# Patient Record
Sex: Male | Born: 1950 | Race: White | Hispanic: No | Marital: Married | Smoking: Never smoker
Health system: Southern US, Community
[De-identification: ages and names within clinical notes are randomized; demographics above are authoritative.]

## PROBLEM LIST (undated history)

## (undated) DIAGNOSIS — C801 Malignant (primary) neoplasm, unspecified: Secondary | ICD-10-CM

## (undated) DIAGNOSIS — M1990 Unspecified osteoarthritis, unspecified site: Secondary | ICD-10-CM

## (undated) DIAGNOSIS — C61 Malignant neoplasm of prostate: Secondary | ICD-10-CM

## (undated) DIAGNOSIS — J189 Pneumonia, unspecified organism: Secondary | ICD-10-CM

## (undated) DIAGNOSIS — I10 Essential (primary) hypertension: Secondary | ICD-10-CM

## (undated) DIAGNOSIS — E785 Hyperlipidemia, unspecified: Secondary | ICD-10-CM

## (undated) DIAGNOSIS — Z22322 Carrier or suspected carrier of Methicillin resistant Staphylococcus aureus: Secondary | ICD-10-CM

## (undated) DIAGNOSIS — Z87442 Personal history of urinary calculi: Secondary | ICD-10-CM

## (undated) HISTORY — DX: Carrier or suspected carrier of methicillin resistant Staphylococcus aureus: Z22.322

## (undated) HISTORY — PX: KNEE ARTHROSCOPY: SHX127

## (undated) HISTORY — DX: Malignant neoplasm of prostate: C61

## (undated) HISTORY — DX: Malignant (primary) neoplasm, unspecified: C80.1

## (undated) HISTORY — DX: Hyperlipidemia, unspecified: E78.5

## (undated) HISTORY — PX: TONSILLECTOMY: SUR1361

## (undated) HISTORY — PX: PROSTATE SURGERY: SHX751

---

## 1988-11-16 HISTORY — PX: SHOULDER ARTHROSCOPY: SHX128

## 2001-11-16 DIAGNOSIS — Z22322 Carrier or suspected carrier of Methicillin resistant Staphylococcus aureus: Secondary | ICD-10-CM

## 2001-11-16 HISTORY — DX: Carrier or suspected carrier of methicillin resistant Staphylococcus aureus: Z22.322

## 2003-11-17 HISTORY — PX: COLONOSCOPY: SHX174

## 2008-06-20 ENCOUNTER — Ambulatory Visit: Payer: Self-pay | Admitting: Internal Medicine

## 2009-09-26 ENCOUNTER — Ambulatory Visit: Payer: Self-pay | Admitting: Internal Medicine

## 2010-02-21 ENCOUNTER — Ambulatory Visit: Payer: Self-pay | Admitting: Urology

## 2010-04-17 ENCOUNTER — Inpatient Hospital Stay (HOSPITAL_COMMUNITY): Admission: RE | Admit: 2010-04-17 | Discharge: 2010-04-18 | Payer: Self-pay | Admitting: Urology

## 2010-04-17 ENCOUNTER — Encounter (INDEPENDENT_AMBULATORY_CARE_PROVIDER_SITE_OTHER): Payer: Self-pay | Admitting: Urology

## 2011-02-02 LAB — HEMOGLOBIN AND HEMATOCRIT, BLOOD
Hemoglobin: 13.7 g/dL (ref 13.0–17.0)
Hemoglobin: 14.7 g/dL (ref 13.0–17.0)

## 2011-02-02 LAB — CBC
HCT: 47.3 % (ref 39.0–52.0)
Hemoglobin: 15.7 g/dL (ref 13.0–17.0)
MCV: 98 fL (ref 78.0–100.0)
Platelets: 187 10*3/uL (ref 150–400)
RBC: 4.82 MIL/uL (ref 4.22–5.81)
WBC: 8 10*3/uL (ref 4.0–10.5)

## 2011-02-02 LAB — ABO/RH: ABO/RH(D): A POS

## 2011-02-02 LAB — BASIC METABOLIC PANEL
Calcium: 9.4 mg/dL (ref 8.4–10.5)
Creatinine, Ser: 1.12 mg/dL (ref 0.4–1.5)
GFR calc non Af Amer: >60 mL/min (ref >60)
Glucose, Bld: 122 mg/dL — ABNORMAL HIGH (ref 70–99)
Potassium: 4.2 mEq/L (ref 3.5–5.1)

## 2011-11-17 DIAGNOSIS — J189 Pneumonia, unspecified organism: Secondary | ICD-10-CM

## 2011-11-17 HISTORY — DX: Pneumonia, unspecified organism: J18.9

## 2012-05-16 LAB — BASIC METABOLIC PANEL
Anion Gap: 5 — ABNORMAL LOW (ref 7–16)
BUN: 18 mg/dL (ref 7–18)
Creatinine: 1.08 mg/dL (ref 0.60–1.30)
EGFR (Non-African Amer.): >60
Osmolality: 284 (ref 275–301)
Sodium: 141 mmol/L (ref 136–145)

## 2012-05-16 LAB — CBC
HGB: 15.1 g/dL (ref 13.0–18.0)
Platelet: 189 10*3/uL (ref 150–440)
RBC: 4.72 10*6/uL (ref 4.40–5.90)
WBC: 8.1 10*3/uL (ref 3.8–10.6)

## 2012-05-16 LAB — TROPONIN I: Troponin-I: 0.03 ng/mL

## 2012-05-16 LAB — CK TOTAL AND CKMB (NOT AT ARMC): CK, Total: 137 U/L (ref 35–232)

## 2012-05-17 ENCOUNTER — Observation Stay: Payer: Self-pay | Admitting: Internal Medicine

## 2012-05-17 LAB — LIPID PANEL
Cholesterol: 119 mg/dL (ref 0–200)
HDL Cholesterol: 31 mg/dL — ABNORMAL LOW (ref 40–60)
Triglycerides: 119 mg/dL (ref 0–200)

## 2012-05-17 LAB — TROPONIN I: Troponin-I: 0.03 ng/mL

## 2014-02-01 ENCOUNTER — Ambulatory Visit: Payer: Self-pay | Admitting: Internal Medicine

## 2014-02-12 ENCOUNTER — Ambulatory Visit: Payer: Self-pay | Admitting: Internal Medicine

## 2014-09-19 ENCOUNTER — Encounter: Payer: Self-pay | Admitting: *Deleted

## 2014-10-15 ENCOUNTER — Other Ambulatory Visit: Payer: Self-pay | Admitting: *Deleted

## 2014-10-15 ENCOUNTER — Encounter: Payer: Self-pay | Admitting: *Deleted

## 2014-10-15 ENCOUNTER — Encounter: Payer: Self-pay | Admitting: General Surgery

## 2014-10-15 ENCOUNTER — Telehealth: Payer: Self-pay | Admitting: *Deleted

## 2014-10-15 ENCOUNTER — Ambulatory Visit (INDEPENDENT_AMBULATORY_CARE_PROVIDER_SITE_OTHER): Payer: BC Managed Care – PPO | Admitting: General Surgery

## 2014-10-15 VITALS — BP 140/76 | HR 76 | Resp 12 | Ht 66.0 in | Wt 199.0 lb

## 2014-10-15 DIAGNOSIS — Z1211 Encounter for screening for malignant neoplasm of colon: Secondary | ICD-10-CM

## 2014-10-15 MED ORDER — POLYETHYLENE GLYCOL 3350 17 GM/SCOOP PO POWD
ORAL | Status: DC
Start: 2014-10-15 — End: 2018-04-22

## 2014-10-15 NOTE — Telephone Encounter (Signed)
Patient contacted and informed that Dr. Jamal Collin will not be in Endoscopy on 11-21-2014 due to a schedule change. We have rescheduled colonoscopy to 12-05-2014 at [facility].   Trish in Endoscopy has been notified of date change.

## 2014-10-15 NOTE — Patient Instructions (Signed)
Colonoscopy  A colonoscopy is an exam to look at the entire large intestine (colon). This exam can help find problems such as tumors, polyps, inflammation, and areas of bleeding. The exam takes about 1 hour.   LET YOUR HEALTH CARE PROVIDER KNOW ABOUT:   · Any allergies you have.  · All medicines you are taking, including vitamins, herbs, eye drops, creams, and over-the-counter medicines.  · Previous problems you or members of your family have had with the use of anesthetics.  · Any blood disorders you have.  · Previous surgeries you have had.  · Medical conditions you have.  RISKS AND COMPLICATIONS   Generally, this is a safe procedure. However, as with any procedure, complications can occur. Possible complications include:  · Bleeding.  · Tearing or rupture of the colon wall.  · Reaction to medicines given during the exam.  · Infection (rare).  BEFORE THE PROCEDURE   · Ask your health care provider about changing or stopping your regular medicines.  · You may be prescribed an oral bowel prep. This involves drinking a large amount of medicated liquid, starting the day before your procedure. The liquid will cause you to have multiple loose stools until your stool is almost clear or light green. This cleans out your colon in preparation for the procedure.  · Do not eat or drink anything else once you have started the bowel prep, unless your health care provider tells you it is safe to do so.  · Arrange for someone to drive you home after the procedure.  PROCEDURE   · You will be given medicine to help you relax (sedative).  · You will lie on your side with your knees bent.  · A long, flexible tube with a light and camera on the end (colonoscope) will be inserted through the rectum and into the colon. The camera sends video back to a computer screen as it moves through the colon. The colonoscope also releases carbon dioxide gas to inflate the colon. This helps your health care provider see the area better.  · During  the exam, your health care provider may take a small tissue sample (biopsy) to be examined under a microscope if any abnormalities are found.  · The exam is finished when the entire colon has been viewed.  AFTER THE PROCEDURE   · Do not drive for 24 hours after the exam.  · You may have a small amount of blood in your stool.  · You may pass moderate amounts of gas and have mild abdominal cramping or bloating. This is caused by the gas used to inflate your colon during the exam.  · Ask when your test results will be ready and how you will get your results. Make sure you get your test results.  Document Released: 10/30/2000 Document Revised: 08/23/2013 Document Reviewed: 07/10/2013  Exit[employer]® Patient Information ©2015 [employer]. This information is not intended to replace advice given to you by your health care provider. Make sure you discuss any questions you have with your health care provider.

## 2014-10-15 NOTE — Progress Notes (Signed)
Patient ID: Dalton Dunn, male   DOB: Jun 15, 1951, 63 y.o.   MRN: [id number]  Chief Complaint  Patient presents with  . Follow-up    colonoscopy    HPI Dalton Dunn is a 63 y.o. male here today for a evaluation of a screening colonoscopy. Patient states he has no GI problems at this time. Patient last colonoscopy was done in 2005  HPI  Past Medical History  Diagnosis Date  . Hyperlipidemia   . MRSA (methicillin resistant staph aureus) culture positive 2003  . Cancer [phone]    skin   . Prostate cancer     Past Surgical History  Procedure Laterality Date  . Prostate surgery    . Tonsillectomy    . Colonoscopy  2005    History reviewed. No pertinent family history.  Social History History  Substance Use Topics  . Smoking status: Never Smoker   . Smokeless tobacco: Never Used  . Alcohol Use: 0.0 oz/week    0 Not specified per week    Allergies  Allergen Reactions  . Penicillin V Potassium Nausea And Vomiting    Current Outpatient Prescriptions  Medication Sig Dispense Refill  . aspirin EC 81 MG tablet Take 81 mg by mouth once.     Marland Kitchen atorvastatin (LIPITOR) 10 MG tablet Take 10 mg by mouth daily at 6 PM.     . betamethasone valerate (VALISONE) 0.1 % cream Apply 1 application topically.     Marland Kitchen lisinopril (PRINIVIL,ZESTRIL) 20 MG tablet Take 20 mg by mouth daily.     . sildenafil (REVATIO) 20 MG tablet Take 20 mg by mouth 3 (three) times daily. Take 3 to 5 tablets po daily as needed     No current facility-administered medications for this visit.    Review of Systems Review of Systems  Respiratory: Negative.   Gastrointestinal: Negative.     Blood pressure 140/76, pulse 76, resp. rate 12, height 5\' 6"  (1.676 m), weight 199 lb (90.266 kg).  Physical Exam Physical Exam  Constitutional: He is oriented to person, place, and time. He appears well-developed and well-nourished.  Eyes: Conjunctivae are normal. No scleral icterus.  Neck: Neck supple. No  thyromegaly present.  Cardiovascular: Normal rate and regular rhythm.   Murmur heard.  Systolic murmur is present with a grade of 1/6  Patient has had echo for murmur  Pulmonary/Chest: Effort normal and breath sounds normal.  Abdominal: Soft. Normal appearance and bowel sounds are normal. There is no hepatosplenomegaly. There is no tenderness. No hernia.  Lymphadenopathy:    He has no cervical adenopathy.  Neurological: He is alert and oriented to person, place, and time.  Skin: Skin is warm and dry.    Data Reviewed None  Assessment    Stable exam. Patient due for screening colonoscopy     Plan    Discussed colonoscopy, schedule for next month per pt request        SANKAR,SEEPLAPUTHUR G 10/15/2014, 11:51 AM

## 2014-10-15 NOTE — Progress Notes (Signed)
Patient ID: Dalton Dunn, male   DOB: 08/30/1951, 63 y.o.   MRN: [id number]  Patient has been scheduled for a colonoscopy on 11-21-2014 at [facility]. It is okay for patient to continue 81 mg aspirin.   Miralax prescription has been sent to patient's pharmacy.

## 2014-11-29 ENCOUNTER — Other Ambulatory Visit: Payer: Self-pay | Admitting: General Surgery

## 2014-11-29 DIAGNOSIS — Z1211 Encounter for screening for malignant neoplasm of colon: Secondary | ICD-10-CM

## 2014-12-05 ENCOUNTER — Ambulatory Visit: Payer: Self-pay | Admitting: General Surgery

## 2014-12-05 DIAGNOSIS — Z1211 Encounter for screening for malignant neoplasm of colon: Secondary | ICD-10-CM

## 2014-12-11 ENCOUNTER — Encounter: Payer: Self-pay | Admitting: General Surgery

## 2015-03-10 NOTE — Discharge Summary (Signed)
PATIENT NAME:  Dalton Dunn, Dalton Dunn MR#:  [id number] DATE OF BIRTH:  05-06-1951  DATE OF ADMISSION:  05/17/2012 DATE OF DISCHARGE:  05/17/2012  PRIMARY CARE PHYSICIAN: Benita Stabile, MD   CARDIOLOGIST: Isaias Cowman, MD   FINAL DIAGNOSES:  1. Chest pain, possibly GI related, cannot rule out cardiac.  2. Hypertension.  3. Hyperlipidemia.  4. History of prostate cancer.   MEDICATIONS ON DISCHARGE:  1. Lisinopril 20 mg daily.  2. Lipitor 10 mg at bedtime.  3. Cialis 5 mg daily. 4. Centrum 1 tablet daily.   ADDITIONAL MEDICATIONS: Aspirin 81 mg daily.   DIET: Low sodium diet.   ACTIVITY: Activity as tolerated.   FOLLOW-UP:  1. Follow-up with Dr. Saralyn Pilar for stress test on Monday in his office. 2. Follow-up in 2 to 4 weeks with Dr. Benita Stabile.   DO NOT TAKE: Advil.   REASON FOR ADMISSION: The patient came in and was discharged on 05/17/2012. The patient came in with chest pain.   HISTORY OF PRESENT ILLNESS: The patient is a 64 year old man with history of hypertension and hyperlipidemia who presented with chest pain, mild shortness of breath, and he was admitted with chest pain. Cardiology consultation was obtained. Cardiac enzymes were ordered. He was given a dose of Lovenox and aspirin.   LABORATORY AND RADIOLOGICAL DATA DURING THE HOSPITAL COURSE: EKG normal sinus rhythm, left atrial enlargement.   Cardiac enzymes x3 were negative. Glucose 111, BUN 18, creatinine 1.08, sodium 141, potassium 3.5, chloride 109, CO2 27, calcium 8.7, white blood cell count 8.1, hemoglobin and hematocrit 15.1 and 46.3, platelet count 189.   Chest x-ray no acute cardiopulmonary abnormality.   LDL 64, HDL 31, triglycerides 119.   HOSPITAL COURSE PER PROBLEM LIST:  1. For the patient's chest pain, this had resolved. The patient had three sets of cardiac enzymes that were negative. This is not a myocardial infarction. Still need to rule out cardiac cause with stress test. This will be set up  through Dr. Saralyn Pilar' office on Monday. Dr. Saralyn Pilar did see as a consultation. Could also be GI related. The patient will be kept on an aspirin for cardioprotection in the meantime.  2. For his hypertension, the patient will be kept on his lisinopril. Blood pressure variable during the hospital stay, as high as 159/84 and as low as 126/70. 3. For his hyperlipidemia, cholesterol profile was excellent. LDL already under 70. HDL is also low. Would recommend exercise after stress test.   4. For his history of prostate cancer, he is on Cialis.  TIME SPENT ON DISCHARGE: 35 minutes.   CONDITION ON DISCHARGE: The patient was discharged home in stable condition.   ____________________________ Tana Conch. Leslye Peer, MD rjw:drc D: 05/17/2012 15:10:56 ET T: 05/18/2012 10:18:03 ET JOB#: [id number]  cc: Tana Conch. Leslye Peer, MD, <Dictator>, Leona Carry. Hall Busing, MD, Isaias Cowman, MD Marisue Brooklyn MD ELECTRONICALLY SIGNED 05/19/2012 12:21

## 2015-03-10 NOTE — Consult Note (Signed)
PATIENT NAME:  Dalton Dunn, Dalton Dunn MR#:  [id number] DATE OF BIRTH:  11-15-1951  DATE OF CONSULTATION:  05/17/2012  REFERRING PHYSICIAN:   CONSULTING PHYSICIAN:  Isaias Cowman, MD  PRIMARY CARE PHYSICIAN: Dr. Hall Busing   CHIEF COMPLAINT: Chest discomfort.   HISTORY OF PRESENT ILLNESS: Patient is a 64 year old gentleman referred for evaluation of chest pain. Patient reports a one-week history of intermittent mild chest discomfort. Patient has been active, exercising regularly without difficulty. On 05/16/2012 the patient was taking out the trash and felt chest discomfort. His wife took him to the urgent care. His blood pressure was elevated and the patient was sent to the [facility] Emergency Room. EKG was nondiagnostic. The patient was admitted to telemetry where he has ruled out for myocardial infarction by CPK isoenzymes and troponin. Patient has had no recurrent chest pain.   PAST MEDICAL HISTORY:  1. Hypertension.  2. Hyperlipidemia.   MEDICATIONS:  1. Lipitor 10 mg at bedtime.  2. Lisinopril 20 mg daily.  3. Cialis 5 mg q.p.m.   SOCIAL HISTORY: Patient is a retired Biomedical scientist. He denies tobacco or EtOH abuse.   FAMILY HISTORY: Brother status post myocardial infarction at age 50.   REVIEW OF SYSTEMS: CONSTITUTIONAL: No fever or chills. EYES: No blurry vision. EARS: No hearing loss. RESPIRATORY: No shortness of breath. CARDIOVASCULAR: Chest discomfort as described above. GASTROINTESTINAL: No nausea, vomiting, diarrhea or constipation. ENDOCRINE: No polyuria or polydipsia. MUSCULOSKELETAL: No arthralgias, myalgias. NEUROLOGICAL: No focal muscle weakness or numbness. PSYCHOLOGICAL: No depression or anxiety.   PHYSICAL EXAMINATION:  VITAL SIGNS: Blood pressure 135/76, pulse 59, respirations 18, temperature 98.5, pulse oximetry 98%.   HEENT: Pupils equal, reactive to light and accommodation.   NECK: Supple without thyromegaly.   LUNGS: Clear.    CARDIOVASCULAR: Normal jugular venous pressure. Normal point of maximal impulse. Regular rate and rhythm. Normal S1, S2. No appreciable gallop, murmur, rub.   ABDOMEN: Soft and nontender. Pulses were intact bilaterally.   MUSCULOSKELETAL: Normal muscle tone.   NEUROLOGIC: Patient was alert and oriented x3. Motor and sensory both grossly intact.   IMPRESSION: 64 year old gentleman with history of hypertension who presents with chest pain in the setting of elevated blood pressure. EKG was nondiagnostic without any acute ischemic ST-T wave changes. Patient did have borderline left atrial enlargement. Patient did have elevated blood pressure likely due to dietary noncompliance. Patient apparently adds salt to his meals.    RECOMMENDATIONS:  1. Continue lisinopril.  2. Would defer beta blocker since patient has bradycardia baseline.  3. Will proceed with ETT sestamibi study which may be performed as an outpatient. Patient has ruled out for myocardial infarction by cardiac isoenzymes and troponin with good prognosis. 4. Follow up after ETT sestamibi study.  5. Had lengthy discussion about the importance of low sodium diet.  ____________________________ Isaias Cowman, MD ap:cms D: 05/17/2012 12:55:59 ET T: 05/17/2012 13:31:20 ET  JOB#: [id number] cc: Isaias Cowman, MD, <Dictator>  Isaias Cowman MD ELECTRONICALLY SIGNED 05/26/2012 8:36

## 2015-03-10 NOTE — H&P (Signed)
PATIENT NAME:  Dalton Dunn, Dalton Dunn MR#:  [id number] DATE OF BIRTH:  January 13, 1951  DATE OF ADMISSION:  05/17/2012   PRIMARY CARE PHYSICIAN: Benita Stabile, MD   REFERRING PHYSICIAN: Dr. Renard Hamper    CHIEF COMPLAINT: Chest pain.   HISTORY OF PRESENT ILLNESS: This is a 64 year old male with significant past medical history of hypertension and hyperlipidemia who presents with chest pain. The patient reports chest pain started at around 4 p.m. when he was throwing some trash. The patient reported he felt midchest tightness radiating to the neck. It was accompanied by some mild shortness of breath. The patient denies any palpitation, diaphoresis, lightheadedness, nausea, or altered mental status. by reviewing the patient's records, he had a stress test done in 2002 by [facility] which revealed some mild ischemia in the anterior area. The patient reports he is not on aspirin. He denies any previous episodes of chest pain. Upon presentation to the ED, the patient's first set of cardiac enzymes was negative. The patient did not have any significant ST wave changes on his EKG. No old EKG to compare. The patient currently reports his chest pain resolved. The patient was given one 62 of aspirin and was given one dose of sub-Q Lovenox for treatment for possible unstable angina. Hospitalist service was requested to admit the patient for further evaluation of his chest pain. The patient reports he usually walks 3 miles every day at a moderate pace which he did today without any complaints of chest pain or shortness of breath.   PAST MEDICAL HISTORY:  1. History of prostate cancer.  2. Hyperlipidemia.  3. Hypertension.   PAST SURGICAL HISTORY:  1. Tonsillectomy.  2. Robotic prostate surgery.  3. Right knee arthroscopy.   ALLERGIES: Penicillin.   HOME MEDICATIONS:  1. Centrum oral 1 tablet oral daily.  2. Cialis 5 mg oral every night, last time taking last night.  3. Lipitor 10 mg oral at bedtime.   4. Lisinopril 20 mg oral daily.   SOCIAL HISTORY: The patient retired recently. Used to work as a Biomedical scientist. No history of smoking. Drinks socially. No drug abuse.   FAMILY HISTORY: Significant for diabetes mellitus and MI at age 42 in his brother. His mother had MI at age 28. His grandmother is alive at the age of 6.   REVIEW OF SYSTEMS: Denies any fever, fatigue, weakness. EYES: Denies blurry vision, double vision, eye pain. ENT: Denies tinnitus, ear pain, hearing loss. RESPIRATORY: Denies any cough, wheezing, hemoptysis, dyspnea, asthma. CARDIOVASCULAR: Has complaints of chest pain. Describes it as tightness, midsternal, radiating to the neck. Denies any edema, arrhythmia, palpitations, syncope. GI: Denies any nausea, vomiting, diarrhea, abdominal pain, hematemesis. GU: Denies any dysuria, hematuria, renal colic, or frequency. GU: Denies any sores. Has history of prostate cancer. ENDOCRINE: Denies any polyuria, polydipsia, heat or cold intolerance, or any thyroid problems. HEMATOLOGY: Denies any anemia, easy bruising, bleeding diathesis. INTEGUMENTARY: Denies any acne, rash, or itching. MUSCULOSKELETAL: Has lower back pain recently due to muscle spasm. He has been taking NSAIDs for that. NEUROLOGIC: Denies any numbness, weakness, dysarthria, epilepsy, tremors, or vertigo. PSYCH: Denies any anxiety, insomnia, alcohol or substance abuse.    PHYSICAL EXAMINATION:   VITAL SIGNS: Temperature 99.1, pulse 60, respiratory rate 18, blood pressure 147/86, saturation 98% on room air.   GENERAL: Well nourished male comfortable in bed in no apparent distress.   HEENT: Head atraumatic, normocephalic. Pupils equal, reactive to light. Pink conjunctivae. Anicteric sclerae. Moist oral mucosa.   NECK:  Supple. No thyromegaly. No JVD.   CHEST: Good air entry bilaterally. No wheezing, rales, or rhonchi. No chest pain reproducible by palpation.   CARDIOVASCULAR: S1, S2 heard. No rubs, murmur, gallops.  Regular rate and rhythm.   ABDOMEN: Soft, nontender, nondistended. Bowel sounds present.   EXTREMITIES: Mild ankle pitting edema. Good pulses. No clubbing. No cyanosis.   NEUROLOGIC: Cranial nerves grossly intact. Motor 5 out of 5. Sensation symmetrical.   PSYCHIATRIC: Appropriate affect. Awake, alert x3. Intact judgment and insight.   SKIN: Warm and dry.  PERTINENT LABS: Glucose 111, BUN 18, creatinine 1.08, sodium 141, potassium 3.5, chloride 109, CO2 27, anion gap 5. Troponin 0.03. White blood cell 8.1, hemoglobin 15.1, hematocrit 46.3, platelets 189.   EKG showing normal sinus rhythm with ventricular rate of 73 beats per minute.   ASSESSMENT AND PLAN:  1. Chest pain. This is a 64 year old male with past medical history of hypertension, hyperlipidemia, mildly abnormal stress test in 2002 who presents with chest pain. The patient does not have any EKG changes. First set of cardiac enzymes is negative. Given anticoagulation in the ED for possible stable angina and started on aspirin. Will cycle three sets of cardiac enzymes. Will continue patient on aspirin. Will check lipid panel in a.m. Will consult Cardiology service for further evaluation of his chest pain and need to continue his sub-Q Lovenox, and if there is any further need of any cardiac imaging or studies to be done. He will be kept n.p.o. after breakfast if there is any need for any further imaging by Cardiology in a.m. Will hold the patient's nitro paste given the fact he is on Cialis, last time given before 24 hours.  2. Hypertension. Was uncontrolled initially upon admission, currently better controlled. Will continue patient on lisinopril and p.r.n.  3. Hyperlipidemia. Will check lipid panel in a.m. and will continue on Lipitor.  4. DVT prophylaxis. The patient received one dose of sub-Q Lovenox 1 mg/kg.  5. GI prophylaxis. Continue with Protonix.   Case was discussed with the patient and the patient's wife at bedside and all  their questions answered. They agree with the plan.   CODE STATUS: The patient is FULL CODE.   TIME SPENT ON PATIENT CARE AND HISTORY AND PHYSICAL: 45 minutes.   ____________________________ Albertine Patricia, MD dse:drc D: 05/17/2012 01:15:38 ET T: 05/17/2012 08:37:27 ET JOB#: [id number]  cc: Albertine Patricia, MD, <Dictator> Leona Carry. Hall Busing, MD Silviano Neuser Graciela Husbands MD ELECTRONICALLY SIGNED 05/18/2012 6:50

## 2018-03-29 ENCOUNTER — Emergency Department: Payer: Medicare Other

## 2018-03-29 ENCOUNTER — Other Ambulatory Visit: Payer: Self-pay

## 2018-03-29 ENCOUNTER — Emergency Department
Admission: EM | Admit: 2018-03-29 | Discharge: 2018-03-29 | Disposition: A | Discharge status: Home / Self Care | Payer: Medicare Other | Attending: Emergency Medicine | Admitting: Emergency Medicine

## 2018-03-29 DIAGNOSIS — R1084 Generalized abdominal pain: Secondary | ICD-10-CM | POA: Diagnosis not present

## 2018-03-29 DIAGNOSIS — Z7982 Long term (current) use of aspirin: Secondary | ICD-10-CM | POA: Diagnosis not present

## 2018-03-29 DIAGNOSIS — I10 Essential (primary) hypertension: Secondary | ICD-10-CM | POA: Diagnosis not present

## 2018-03-29 DIAGNOSIS — R109 Unspecified abdominal pain: Secondary | ICD-10-CM | POA: Diagnosis present

## 2018-03-29 DIAGNOSIS — Z79899 Other long term (current) drug therapy: Secondary | ICD-10-CM | POA: Diagnosis not present

## 2018-03-29 DIAGNOSIS — N200 Calculus of kidney: Secondary | ICD-10-CM

## 2018-03-29 HISTORY — DX: Essential (primary) hypertension: I10

## 2018-03-29 LAB — URINALYSIS, COMPLETE (UACMP) WITH MICROSCOPIC
BACTERIA UA: NONE SEEN
Bilirubin Urine: NEGATIVE
Glucose, UA: NEGATIVE mg/dL
Ketones, ur: NEGATIVE mg/dL
LEUKOCYTES UA: NEGATIVE
NITRITE: NEGATIVE
PROTEIN: NEGATIVE mg/dL
RBC / HPF: >50 RBC/hpf — ABNORMAL HIGH (ref 0–5)
SPECIFIC GRAVITY, URINE: 1.014 (ref 1.005–1.030)
SQUAMOUS EPITHELIAL / LPF: NONE SEEN (ref 0–5)
pH: 8 (ref 5.0–8.0)

## 2018-03-29 LAB — CBC WITH DIFFERENTIAL/PLATELET
BASOS ABS: 0 10*3/uL (ref 0–0.1)
Basophils Relative: 0 %
Eosinophils Absolute: 0.2 10*3/uL (ref 0–0.7)
Eosinophils Relative: 1 %
HEMATOCRIT: 39.4 % — AB (ref 40.0–52.0)
Hemoglobin: 12.5 g/dL — ABNORMAL LOW (ref 13.0–18.0)
Lymphocytes Relative: 25 %
Lymphs Abs: 3.4 10*3/uL (ref 1.0–3.6)
MCH: 25.5 pg — ABNORMAL LOW (ref 26.0–34.0)
MCHC: 31.7 g/dL — AB (ref 32.0–36.0)
MCV: 80.5 fL (ref 80.0–100.0)
MONO ABS: 1.1 10*3/uL — AB (ref 0.2–1.0)
MONOS PCT: 8 %
NEUTROS ABS: 9 10*3/uL — AB (ref 1.4–6.5)
NEUTROS PCT: 66 %
PLATELETS: 283 10*3/uL (ref 150–440)
RBC: 4.9 MIL/uL (ref 4.40–5.90)
RDW: 16.5 % — AB (ref 11.5–14.5)
WBC: 13.6 10*3/uL — ABNORMAL HIGH (ref 3.8–10.6)

## 2018-03-29 LAB — COMPREHENSIVE METABOLIC PANEL
ALBUMIN: 4.1 g/dL (ref 3.5–5.0)
ALT: 19 U/L (ref 17–63)
ANION GAP: 6 (ref 5–15)
AST: 46 U/L — AB (ref 15–41)
Alkaline Phosphatase: 68 U/L (ref 38–126)
BILIRUBIN TOTAL: 0.6 mg/dL (ref 0.3–1.2)
BUN: 17 mg/dL (ref 6–20)
CHLORIDE: 107 mmol/L (ref 101–111)
CO2: 26 mmol/L (ref 22–32)
Calcium: 8.9 mg/dL (ref 8.9–10.3)
Creatinine, Ser: 1.21 mg/dL (ref 0.61–1.24)
GFR calc Af Amer: >60 mL/min (ref >60)
GFR calc non Af Amer: >60 mL/min (ref >60)
GLUCOSE: 112 mg/dL — AB (ref 65–99)
POTASSIUM: 3.9 mmol/L (ref 3.5–5.1)
Sodium: 139 mmol/L (ref 135–145)
TOTAL PROTEIN: 7.7 g/dL (ref 6.5–8.1)

## 2018-03-29 LAB — LIPASE, BLOOD: Lipase: 31 U/L (ref 11–51)

## 2018-03-29 MED ORDER — SODIUM CHLORIDE 0.9 % IV BOLUS
1000.0000 mL | Freq: Once | INTRAVENOUS | Status: AC
  Administered 2018-03-29: 1000 mL via INTRAVENOUS

## 2018-03-29 MED ORDER — KETOROLAC TROMETHAMINE 30 MG/ML IJ SOLN
30.0000 mg | Freq: Once | INTRAMUSCULAR | Status: AC
  Administered 2018-03-29: 30 mg via INTRAVENOUS

## 2018-03-29 MED ORDER — IBUPROFEN 800 MG PO TABS: 800.0000 mg | ORAL_TABLET | Freq: Three times a day (TID) | ORAL | 0 refills | Status: DC | PRN

## 2018-03-29 MED ORDER — HYDROMORPHONE HCL 1 MG/ML IJ SOLN
INTRAMUSCULAR | Status: AC
  Administered 2018-03-29: 1 mg via INTRAVENOUS
  Filled 2018-03-29: qty 1

## 2018-03-29 MED ORDER — ONDANSETRON HCL 4 MG/2ML IJ SOLN
INTRAMUSCULAR | Status: AC
  Administered 2018-03-29: 4 mg via INTRAVENOUS
  Filled 2018-03-29: qty 2

## 2018-03-29 MED ORDER — ONDANSETRON HCL 4 MG/2ML IJ SOLN
4.0000 mg | Freq: Once | INTRAMUSCULAR | Status: AC
  Administered 2018-03-29: 4 mg via INTRAVENOUS

## 2018-03-29 MED ORDER — ONDANSETRON 4 MG PO TBDP: 4.0000 mg | ORAL_TABLET | Freq: Three times a day (TID) | ORAL | 0 refills | Status: DC | PRN

## 2018-03-29 MED ORDER — TAMSULOSIN HCL 0.4 MG PO CAPS: 0.4000 mg | ORAL_CAPSULE | Freq: Every day | ORAL | 0 refills | Status: DC

## 2018-03-29 MED ORDER — OXYCODONE-ACETAMINOPHEN 5-325 MG PO TABS: 1.0000 | ORAL_TABLET | ORAL | 0 refills | Status: DC | PRN

## 2018-03-29 MED ORDER — KETOROLAC TROMETHAMINE 30 MG/ML IJ SOLN
INTRAMUSCULAR | Status: AC
  Filled 2018-03-29: qty 1

## 2018-03-29 MED ORDER — HYDROMORPHONE HCL 1 MG/ML IJ SOLN
1.0000 mg | Freq: Once | INTRAMUSCULAR | Status: AC
  Administered 2018-03-29: 1 mg via INTRAVENOUS

## 2018-03-29 NOTE — ED Provider Notes (Signed)
[facility] Emergency Department Provider Note  Time seen: 7:24 AM  I have reviewed the triage vital signs and the nursing notes.   HISTORY  Chief Complaint Abdominal Pain    HPI Dalton Dunn is a 67 y.o. male with a past medical history of hypertension, hyperlipidemia, prior kidney stones, presents to the emergency department for right abdominal pain.  According to the patient he was awoken from sleep around 4:00 this morning with severe right flank/right lower quadrant abdominal pain.  Describes as sharp, nauseating.  Multiple episodes of vomiting.  States for the past 4 weeks is intermittently seen blood in his urine, was seen by his doctor who actually ordered a CT scan for next week.  Patient denies dysuria.  Denies fever.  Denies diarrhea black or bloody stool.  Had a bowel movement this morning around 4:00.  Describes his pain as sharp and severe in the right lower quadrant currently.  States he has had kidney stones in the past but all of his pain with kidney stones is always been in the back never in the abdomen.   Past Medical History:  Diagnosis Date  . Cancer ([place]) [phone]   skin   . Hyperlipidemia   . Hypertension   . MRSA (methicillin resistant staph aureus) culture positive 2003  . Prostate cancer ([place])     There are no active problems to display for this patient.   Past Surgical History:  Procedure Laterality Date  . COLONOSCOPY  2005  . PROSTATE SURGERY    . TONSILLECTOMY      Prior to Admission medications   Medication Sig Start Date End Date Taking? Authorizing Provider  aspirin EC 81 MG tablet Take 81 mg by mouth once.     [provider]  atorvastatin (LIPITOR) 10 MG tablet Take 10 mg by mouth daily at 6 PM.     [provider]  betamethasone valerate (VALISONE) 0.1 % cream Apply 1 application topically.  03/18/14   [provider]  lisinopril (PRINIVIL,ZESTRIL) 20 MG tablet Take 20 mg by mouth  daily.     [provider]  polyethylene glycol powder (GLYCOLAX/MIRALAX) powder 255 grams one bottle for colonoscopy prep 10/15/14   Christene Lye, MD  sildenafil (REVATIO) 20 MG tablet Take 20 mg by mouth 3 (three) times daily. Take 3 to 5 tablets po daily as needed 02/16/14   [provider]    Allergies  Allergen Reactions  . Penicillin V Potassium Nausea And Vomiting    No family history on file.  Social History Social History   Tobacco Use  . Smoking status: Never Smoker  . Smokeless tobacco: Never Used  Substance Use Topics  . Alcohol use: Yes    Alcohol/week: 0.0 oz  . Drug use: No    Review of Systems Constitutional: Negative for fever. Eyes: Negative for visual complaints ENT: Negative for recent illness/congestion Cardiovascular: Negative for chest pain. Respiratory: Negative for shortness of breath. Gastrointestinal: Positive for right-sided abdominal pain, sharp, severe, positive for nausea vomiting.  Negative diarrhea Genitourinary: Intermittent hematuria x4 weeks. Musculoskeletal: Negative for musculoskeletal complaints Skin: Negative for skin complaints  Neurological: Negative for headache All other ROS negative  ____________________________________________   PHYSICAL EXAM:  VITAL SIGNS: ED Triage Vitals  Enc Vitals Group     BP 03/29/18 0718 (!) 198/97     Pulse Rate 03/29/18 0718 80     Resp 03/29/18 0718 20     Temp 03/29/18 0719 98  F (36.7 C)     Temp Source 03/29/18 0719 Oral     SpO2 03/29/18 0718 100 %     Weight 03/29/18 0719 195 lb (88.5 kg)     Height 03/29/18 0719 5\' 6"  (1.676 m)     Head Circumference --      Peak Flow --      Pain Score 03/29/18 0718 10     Pain Loc --      Pain Edu? --      Excl. in [place]? --    Constitutional: Alert and oriented.  Mild distress due to abdominal pain. Eyes: Normal exam ENT   Head: Normocephalic and atraumatic.   Mouth/Throat: Mucous membranes are  moist. Cardiovascular: Normal rate, regular rhythm. No murmur Respiratory: Normal respiratory effort without tachypnea nor retractions. Breath sounds are clear  Gastrointestinal: Soft, moderate right mid and right lower quadrant tenderness to palpation.  No rebound or guarding.  No distention. Musculoskeletal: Nontender with normal range of motion in all extremities.  Neurologic:  Normal speech and language. No gross focal neurologic deficits  Skin:  Skin is warm, dry and intact.  Psychiatric: Mood and affect are normal.   ____________________________________________   RADIOLOGY  CT scan shows 3 mm distal right ureteral stone.  Moderate hydronephrosis.  ____________________________________________   INITIAL IMPRESSION / ASSESSMENT AND PLAN / ED COURSE  Pertinent labs & imaging results that were available during my care of the patient were reviewed by me and considered in my medical decision making (see chart for details).  Patient presents to the emergency department for right-sided abdominal pain beginning around 4:00 this morning awakening him from his sleep.  Describes the pain is sharp, severe.  Differential would include ureterolithiasis, appendicitis, colitis or diverticulitis, urinary tract infection or pyelonephritis.  Given the intermittent hematuria with sharp severe pain highly suspect ureterolithiasis will proceed with a CT renal scan to further evaluate.  We will treat pain, nausea, IV hydrate and obtain l we will ab work.  Patient agreeable with this plan of care.  CT scan consistent with distal right ureteral stone.  Mild leukocytosis of 13,600.  Urinalysis is normal.  No signs of infection.  Urine culture has been sent.  Patient will follow-up with Dr. Jacqlyn Larsen, has an appointment this week already.  Patient's pain is gone after Toradol.  We will discharge with ibuprofen, Flomax, Percocet and Zofran to be used as needed.  Patient agreeable to plan of care.  I discussed return  precautions for fever return of/worsening pain.  ____________________________________________   FINAL CLINICAL IMPRESSION(S) / ED DIAGNOSES  Right flank pain    Harvest Dark, MD 03/29/18 [phone]

## 2018-03-29 NOTE — ED Notes (Signed)
Pt alert and oriented X4, active, cooperative, pt in NAD. RR even and unlabored, color WNL.  Pt informed to return if any life threatening symptoms occur.  Discharge and followup instructions reviewed. Given strainer to take home for urine. Ambulates safely for discharge.

## 2018-03-29 NOTE — ED Triage Notes (Signed)
To ER via POV c/o RLQ pain that began at 4AM, worsening pain and emesis sine 6AM. Pt dry heaving. Appears to be uncomfortable.

## 2018-03-29 NOTE — ED Notes (Signed)
ED Provider at bedside. 

## 2018-03-29 NOTE — Discharge Instructions (Addendum)
As we discussed please take Flomax once a day every day until you passed her kidney stone.  Please use ibuprofen every 8 hours for the next 48 hours and then use as needed.  Please use your Zofran and Percocet as needed for nausea and pain but only as written.  Please follow-up with Dr. Jacqlyn Larsen, please call today to inform them of today's ER visit and a CT scan which was positive for right-sided kidney stone/ureteral stone.  Return to the emergency department for any fever, worsening pain (unable to control at home) or any other symptom personally concerning to yourself.

## 2018-03-29 NOTE — ED Notes (Signed)
Pt up to restroom.

## 2018-03-29 NOTE — ED Notes (Signed)
First Nurse Note: Patient assisted from car into [facility], complaining of RLQ abdominal pain, retching.  States N&V started at 0400 and pain onset at 0630.  Pale, unable to sit still.  Taken to room 17.

## 2018-03-30 LAB — URINE CULTURE: Culture: NO GROWTH

## 2018-04-22 ENCOUNTER — Encounter: Payer: Self-pay | Admitting: Urology

## 2018-04-22 ENCOUNTER — Ambulatory Visit (INDEPENDENT_AMBULATORY_CARE_PROVIDER_SITE_OTHER): Payer: Medicare Other | Admitting: Urology

## 2018-04-22 VITALS — BP 163/93 | HR 97 | Ht 66.0 in | Wt 202.0 lb

## 2018-04-22 DIAGNOSIS — N201 Calculus of ureter: Secondary | ICD-10-CM | POA: Diagnosis not present

## 2018-04-22 LAB — URINALYSIS, COMPLETE
Bilirubin, UA: NEGATIVE
GLUCOSE, UA: NEGATIVE
Ketones, UA: NEGATIVE
Nitrite, UA: NEGATIVE
Protein, UA: NEGATIVE
RBC, UA: NEGATIVE
SPEC GRAV UA: 1.015 (ref 1.005–1.030)
Urobilinogen, Ur: 0.2 mg/dL (ref 0.2–1.0)
pH, UA: 7 (ref 5.0–7.5)

## 2018-04-22 LAB — MICROSCOPIC EXAMINATION

## 2018-04-26 ENCOUNTER — Other Ambulatory Visit: Payer: Self-pay | Admitting: Radiology

## 2018-04-26 DIAGNOSIS — N29 Other disorders of kidney and ureter in diseases classified elsewhere: Principal | ICD-10-CM

## 2018-04-26 MED ORDER — TAMSULOSIN HCL 0.4 MG PO CAPS: 0.4000 mg | ORAL_CAPSULE | Freq: Every day | ORAL | 0 refills | Status: DC

## 2018-04-26 NOTE — H&P (View-Only) (Signed)
04/22/2018 7:00 AM   Dalton Dunn [birth year] [id number]  Referring provider: Albina Billet, MD [address]   [place], [place] [address]  Chief Complaint  Patient presents with  . Nephrolithiasis    HPI: Dalton Dunn is a 67 year old male who presented to the [facility] ED on 03/29/2018 with acute onset of right flank pain radiating to the right lower quadrant.  The pain was described as severe without identifiable precipitating or aggravating factors.  He had associated nausea and vomiting.  His pain was improved with parenteral analgesics and antiemetics.  A stone protocol CT of the abdomen pelvis was performed which showed a 3 mm right distal ureteral calculus with moderate hydronephrosis/hydroureter.  Since his ED visit he has continued with intermittent pain which has been controlled with Percocet.  He is on tamsulosin.  He has 2 previous episodes of renal colic the last 8756.  He is scheduled for shoulder surgery in mid July.  He denies fever or chills.  He has been followed by Dr. 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 Mendel Ryder in June 2011.  PMH: Past Medical History:  Diagnosis Date  . Cancer ([place]) [phone]   skin   . Hyperlipidemia   . Hypertension   . MRSA (methicillin resistant staph aureus) culture positive 2003  . Prostate cancer [facility])     Surgical History: Past Surgical History:  Procedure Laterality Date  . COLONOSCOPY  2005  . PROSTATE SURGERY    . TONSILLECTOMY      Home Medications:  Allergies as of 04/22/2018      Reactions   Penicillin V Potassium Nausea And Vomiting      Medication List        Accurate as of 04/22/18 11:59 PM. Always use your most recent med list.          aspirin EC 81 MG tablet Take 81 mg by mouth once.   atorvastatin 10 MG tablet Commonly known as:  LIPITOR Take 10 mg by mouth daily at 6 PM.   betamethasone valerate 0.1 % cream Commonly known as:   VALISONE Apply 1 application topically.   ibuprofen 800 MG tablet Commonly known as:  ADVIL,MOTRIN Take 1 tablet (800 mg total) by mouth every 8 (eight) hours as needed.   lisinopril 20 MG tablet Commonly known as:  PRINIVIL,ZESTRIL Take 20 mg by mouth daily.   ondansetron 4 MG disintegrating tablet Commonly known as:  ZOFRAN ODT Take 1 tablet (4 mg total) by mouth every 8 (eight) hours as needed for nausea or vomiting.   oxyCODONE-acetaminophen 5-325 MG tablet Commonly known as:  PERCOCET Take 1 tablet by mouth every 4 (four) hours as needed for severe pain.   sildenafil 20 MG tablet Commonly known as:  REVATIO Take 20 mg by mouth 3 (three) times daily. Take 3 to 5 tablets po daily as needed   tamsulosin 0.4 MG Caps capsule Commonly known as:  FLOMAX Take 1 capsule (0.4 mg total) by mouth daily.       Allergies:  Allergies  Allergen Reactions  . Penicillin V Potassium Nausea And Vomiting    Family History: No family history on file.  Social History:  reports that he has never smoked. He has never used smokeless tobacco. He reports that he drinks alcohol. He reports that he does not use drugs.  ROS: UROLOGY Frequent Urination?: No Hard to postpone urination?: No Burning/pain with urination?: No Get up at  night to urinate?: No Urine stream starts and stops?: No Trouble starting stream?: No Do you have to strain to urinate?: No Blood in urine?: Yes Urinary tract infection?: No Sexually transmitted disease?: No Injury to kidneys or bladder?: No Painful intercourse?: No Weak stream?: No Erection problems?: No Penile pain?: No  Gastrointestinal Nausea?: Yes Vomiting?: No Indigestion/heartburn?: No Diarrhea?: No Constipation?: No  Constitutional Fever: No Night sweats?: No Weight loss?: No Fatigue?: Yes  Skin Skin rash/lesions?: No Itching?: No  Eyes Blurred vision?: No Double vision?: No  Ears/Nose/Throat Sore throat?: No Sinus problems?:  No  Hematologic/Lymphatic Swollen glands?: No Easy bruising?: No  Cardiovascular Leg swelling?: No Chest pain?: No  Respiratory Cough?: No Shortness of breath?: No  Endocrine Excessive thirst?: No  Musculoskeletal Back pain?: Yes Joint pain?: No  Neurological Headaches?: No Dizziness?: No  Psychologic Depression?: No Anxiety?: No  Physical Exam: BP (!) 163/93   Pulse 97   Ht 5\' 6"  (1.676 m)   Wt 202 lb (91.6 kg)   BMI 32.60 kg/m   Constitutional:  Alert and oriented, No acute distress. HEENT: [place] AT, moist mucus membranes.  Trachea midline, no masses. Cardiovascular: No clubbing, cyanosis, or edema.  RRR Respiratory: Normal respiratory effort, no increased work of breathing.   Lungs clear GI: Abdomen is soft, nontender, nondistended, no abdominal masses GU: No CVA tenderness Lymph: No cervical or inguinal lymphadenopathy. Skin: No rashes, bruises or suspicious lesions. Neurologic: Grossly intact, no focal deficits, moving all 4 extremities. Psychiatric: Normal mood and affect.  Laboratory Data: Lab Results  Component Value Date   WBC 13.6 (H) 03/29/2018   HGB 12.5 (L) 03/29/2018   HCT 39.4 (L) 03/29/2018   MCV 80.5 03/29/2018   PLT 283 03/29/2018    Lab Results  Component Value Date   CREATININE 1.21 03/29/2018    Pertinent Imaging: CT was personally reviewed  Results for orders placed during the hospital encounter of 03/29/18  CT Renal Stone Study   Narrative CLINICAL DATA:  Pelvic region pain.  History of prostate carcinoma  EXAM: CT ABDOMEN AND PELVIS WITHOUT CONTRAST  TECHNIQUE: Multidetector CT imaging of the abdomen and pelvis was performed following the standard protocol without oral or IV contrast.  COMPARISON:  MR pelvis February 21, 2010  FINDINGS: Lower chest: Lung bases are clear. There are foci of coronary artery calcification. There is a small hiatal hernia.  Hepatobiliary: No focal liver lesions are appreciable on  this noncontrast enhanced study. Gallbladder wall is not appreciably thickened. There is no biliary duct dilatation.  Pancreas: There is no evident pancreatic mass or inflammatory focus.  Spleen: No splenic lesions are evident.  Adrenals/Urinary Tract: Adrenals bilaterally appear normal. Right kidney is edematous. There is no renal mass on either side. There is moderate hydronephrosis on the right. There is no appreciable hydronephrosis on the left. There is no intrarenal calculus on either side. There is edema of the right ureter to the level of a 3 mm calculus in the distal right ureter at the mid acetabular level on the right. No other ureteral calculus is identified on either side. Urinary bladder is midline with wall thickness within normal limits.  Stomach/Bowel: There is no appreciable bowel wall or mesenteric thickening. No evident bowel obstruction. No free air or portal venous air.  Vascular/Lymphatic: There is atherosclerotic calcification in the aorta and common iliac arteries. No aneurysm demonstrable. Major mesenteric arterial vessels appear patent on this noncontrast enhanced study. There is no appreciable adenopathy in the abdomen or  pelvis.  Reproductive: Prostate is absent.  No evident pelvic mass.  Other: Appendix not seen. No periappendiceal region inflammation. No abscess or ascites is evident in the abdomen or pelvis. There is a minimal ventral hernia containing only fat.  Musculoskeletal: There is extensive degenerative change in the lumbar spine region. There is a partially calcified disc extrusion on the right at L3-4. There are no evident blastic or lytic bone lesions. There is no appreciable intramuscular or abdominal wall lesion.  IMPRESSION: 1. 3 mm calculus distal right ureter at the level of the right acetabulum. There is moderate hydronephrosis and ureterectasis proximal to this calculus. There is right renal edema.  2.  Absent prostate.   No pelvic mass evident.  3. No bowel obstruction. No abscess. Appendix not seen. No periappendiceal region inflammation.  4. Calcified disc extrusion on the right at L3-4 impressing on the exiting nerve root on the right at this level.  5. Aortoiliac atherosclerosis. There is also coronary artery calcification evident.  6.  Small hiatal hernia.  Small ventral hernia containing only fat.  Aortic Atherosclerosis (ICD10-I70.0).   Electronically Signed   By: Lowella Grip III M.D.   On: 03/29/2018 07:47     Assessment & Plan:   67 year old male with a small right distal ureteral calculus however has not passed a stone since initial diagnosis 3 weeks ago.  His stone is probably too small to localize on x-ray for shockwave lithotripsy.  Based on the location his most successful treatment option would be ureteroscopic removal.  He would like to get this tentatively scheduled and will contact the office if he passes his stone in the meantime.  The indications and nature of the planned procedure were discussed as well as the potential  benefits and expected outcome.  Alternatives have been discussed in detail. The most common complications and side effects were discussed including but not limited to infection/sepsis; blood loss; damage to urethra, bladder, ureter, need for multiple surgeries; need for prolonged stent placement as well as general anesthesia risks.   All of his questions were answered and he desires to proceed.  At the time of our visit he did not appear to be distracted or in pain.    Abbie Sons, [place] [address] [place], [place] [address] [phone]

## 2018-04-26 NOTE — Progress Notes (Signed)
04/22/2018 7:00 AM   Dalton Dunn 1951/01/08 [id number]  Referring provider: Albina Billet, MD [address]   [place], [place] [address]  Chief Complaint  Patient presents with  . Nephrolithiasis    HPI: Dalton Dunn is a 67 year old male who presented to the [facility] ED on 03/29/2018 with acute onset of right flank pain radiating to the right lower quadrant.  The pain was described as severe without identifiable precipitating or aggravating factors.  He had associated nausea and vomiting.  His pain was improved with parenteral analgesics and antiemetics.  A stone protocol CT of the abdomen pelvis was performed which showed a 3 mm right distal ureteral calculus with moderate hydronephrosis/hydroureter.  Since his ED visit he has continued with intermittent pain which has been controlled with Percocet.  He is on tamsulosin.  He has 2 previous episodes of renal colic the last 9528.  He is scheduled for shoulder surgery in mid July.  He denies fever or chills.  He has been followed by Dr. 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 Mendel Ryder in June 2011.  PMH: Past Medical History:  Diagnosis Date  . Cancer ([place]) [phone]   skin   . Hyperlipidemia   . Hypertension   . MRSA (methicillin resistant staph aureus) culture positive 2003  . Prostate cancer [facility])     Surgical History: Past Surgical History:  Procedure Laterality Date  . COLONOSCOPY  2005  . PROSTATE SURGERY    . TONSILLECTOMY      Home Medications:  Allergies as of 04/22/2018      Reactions   Penicillin V Potassium Nausea And Vomiting      Medication List        Accurate as of 04/22/18 11:59 PM. Always use your most recent med list.          aspirin EC 81 MG tablet Take 81 mg by mouth once.   atorvastatin 10 MG tablet Commonly known as:  LIPITOR Take 10 mg by mouth daily at 6 PM.   betamethasone valerate 0.1 % cream Commonly known as:   VALISONE Apply 1 application topically.   ibuprofen 800 MG tablet Commonly known as:  ADVIL,MOTRIN Take 1 tablet (800 mg total) by mouth every 8 (eight) hours as needed.   lisinopril 20 MG tablet Commonly known as:  PRINIVIL,ZESTRIL Take 20 mg by mouth daily.   ondansetron 4 MG disintegrating tablet Commonly known as:  ZOFRAN ODT Take 1 tablet (4 mg total) by mouth every 8 (eight) hours as needed for nausea or vomiting.   oxyCODONE-acetaminophen 5-325 MG tablet Commonly known as:  PERCOCET Take 1 tablet by mouth every 4 (four) hours as needed for severe pain.   sildenafil 20 MG tablet Commonly known as:  REVATIO Take 20 mg by mouth 3 (three) times daily. Take 3 to 5 tablets po daily as needed   tamsulosin 0.4 MG Caps capsule Commonly known as:  FLOMAX Take 1 capsule (0.4 mg total) by mouth daily.       Allergies:  Allergies  Allergen Reactions  . Penicillin V Potassium Nausea And Vomiting    Family History: No family history on file.  Social History:  reports that he has never smoked. He has never used smokeless tobacco. He reports that he drinks alcohol. He reports that he does not use drugs.  ROS: UROLOGY Frequent Urination?: No Hard to postpone urination?: No Burning/pain with urination?: No Get up at  night to urinate?: No Urine stream starts and stops?: No Trouble starting stream?: No Do you have to strain to urinate?: No Blood in urine?: Yes Urinary tract infection?: No Sexually transmitted disease?: No Injury to kidneys or bladder?: No Painful intercourse?: No Weak stream?: No Erection problems?: No Penile pain?: No  Gastrointestinal Nausea?: Yes Vomiting?: No Indigestion/heartburn?: No Diarrhea?: No Constipation?: No  Constitutional Fever: No Night sweats?: No Weight loss?: No Fatigue?: Yes  Skin Skin rash/lesions?: No Itching?: No  Eyes Blurred vision?: No Double vision?: No  Ears/Nose/Throat Sore throat?: No Sinus problems?:  No  Hematologic/Lymphatic Swollen glands?: No Easy bruising?: No  Cardiovascular Leg swelling?: No Chest pain?: No  Respiratory Cough?: No Shortness of breath?: No  Endocrine Excessive thirst?: No  Musculoskeletal Back pain?: Yes Joint pain?: No  Neurological Headaches?: No Dizziness?: No  Psychologic Depression?: No Anxiety?: No  Physical Exam: BP (!) 163/93   Pulse 97   Ht 5\' 6"  (1.676 m)   Wt 202 lb (91.6 kg)   BMI 32.60 kg/m   Constitutional:  Alert and oriented, No acute distress. HEENT: [place] AT, moist mucus membranes.  Trachea midline, no masses. Cardiovascular: No clubbing, cyanosis, or edema.  RRR Respiratory: Normal respiratory effort, no increased work of breathing.   Lungs clear GI: Abdomen is soft, nontender, nondistended, no abdominal masses GU: No CVA tenderness Lymph: No cervical or inguinal lymphadenopathy. Skin: No rashes, bruises or suspicious lesions. Neurologic: Grossly intact, no focal deficits, moving all 4 extremities. Psychiatric: Normal mood and affect.  Laboratory Data: Lab Results  Component Value Date   WBC 13.6 (H) 03/29/2018   HGB 12.5 (L) 03/29/2018   HCT 39.4 (L) 03/29/2018   MCV 80.5 03/29/2018   PLT 283 03/29/2018    Lab Results  Component Value Date   CREATININE 1.21 03/29/2018    Pertinent Imaging: CT was personally reviewed  Results for orders placed during the hospital encounter of 03/29/18  CT Renal Stone Study   Narrative CLINICAL DATA:  Pelvic region pain.  History of prostate carcinoma  EXAM: CT ABDOMEN AND PELVIS WITHOUT CONTRAST  TECHNIQUE: Multidetector CT imaging of the abdomen and pelvis was performed following the standard protocol without oral or IV contrast.  COMPARISON:  MR pelvis February 21, 2010  FINDINGS: Lower chest: Lung bases are clear. There are foci of coronary artery calcification. There is a small hiatal hernia.  Hepatobiliary: No focal liver lesions are appreciable on  this noncontrast enhanced study. Gallbladder wall is not appreciably thickened. There is no biliary duct dilatation.  Pancreas: There is no evident pancreatic mass or inflammatory focus.  Spleen: No splenic lesions are evident.  Adrenals/Urinary Tract: Adrenals bilaterally appear normal. Right kidney is edematous. There is no renal mass on either side. There is moderate hydronephrosis on the right. There is no appreciable hydronephrosis on the left. There is no intrarenal calculus on either side. There is edema of the right ureter to the level of a 3 mm calculus in the distal right ureter at the mid acetabular level on the right. No other ureteral calculus is identified on either side. Urinary bladder is midline with wall thickness within normal limits.  Stomach/Bowel: There is no appreciable bowel wall or mesenteric thickening. No evident bowel obstruction. No free air or portal venous air.  Vascular/Lymphatic: There is atherosclerotic calcification in the aorta and common iliac arteries. No aneurysm demonstrable. Major mesenteric arterial vessels appear patent on this noncontrast enhanced study. There is no appreciable adenopathy in the abdomen or  pelvis.  Reproductive: Prostate is absent.  No evident pelvic mass.  Other: Appendix not seen. No periappendiceal region inflammation. No abscess or ascites is evident in the abdomen or pelvis. There is a minimal ventral hernia containing only fat.  Musculoskeletal: There is extensive degenerative change in the lumbar spine region. There is a partially calcified disc extrusion on the right at L3-4. There are no evident blastic or lytic bone lesions. There is no appreciable intramuscular or abdominal wall lesion.  IMPRESSION: 1. 3 mm calculus distal right ureter at the level of the right acetabulum. There is moderate hydronephrosis and ureterectasis proximal to this calculus. There is right renal edema.  2.  Absent prostate.   No pelvic mass evident.  3. No bowel obstruction. No abscess. Appendix not seen. No periappendiceal region inflammation.  4. Calcified disc extrusion on the right at L3-4 impressing on the exiting nerve root on the right at this level.  5. Aortoiliac atherosclerosis. There is also coronary artery calcification evident.  6.  Small hiatal hernia.  Small ventral hernia containing only fat.  Aortic Atherosclerosis (ICD10-I70.0).   Electronically Signed   By: Lowella Grip III M.D.   On: 03/29/2018 07:47     Assessment & Plan:   67 year old male with a small right distal ureteral calculus however has not passed a stone since initial diagnosis 3 weeks ago.  His stone is probably too small to localize on x-ray for shockwave lithotripsy.  Based on the location his most successful treatment option would be ureteroscopic removal.  He would like to get this tentatively scheduled and will contact the office if he passes his stone in the meantime.  The indications and nature of the planned procedure were discussed as well as the potential  benefits and expected outcome.  Alternatives have been discussed in detail. The most common complications and side effects were discussed including but not limited to infection/sepsis; blood loss; damage to urethra, bladder, ureter, need for multiple surgeries; need for prolonged stent placement as well as general anesthesia risks.   All of his questions were answered and he desires to proceed.  At the time of our visit he did not appear to be distracted or in pain.    Abbie Sons, [place] [address], [place], [place] [address] [phone]

## 2018-04-27 ENCOUNTER — Other Ambulatory Visit: Payer: Self-pay | Admitting: Radiology

## 2018-04-28 ENCOUNTER — Other Ambulatory Visit: Payer: Self-pay | Admitting: Radiology

## 2018-04-28 DIAGNOSIS — N201 Calculus of ureter: Secondary | ICD-10-CM

## 2018-05-01 ENCOUNTER — Encounter: Payer: Self-pay | Admitting: Urology

## 2018-05-01 DIAGNOSIS — N201 Calculus of ureter: Secondary | ICD-10-CM | POA: Insufficient documentation

## 2018-05-02 ENCOUNTER — Other Ambulatory Visit: Payer: Self-pay | Admitting: Radiology

## 2018-05-04 ENCOUNTER — Other Ambulatory Visit: Payer: Self-pay

## 2018-05-04 ENCOUNTER — Encounter
Admission: RE | Admit: 2018-05-04 | Discharge: 2018-05-04 | Disposition: A | Discharge status: Home / Self Care | Payer: Medicare Other | Source: Ambulatory Visit | Attending: Urology | Admitting: Urology

## 2018-05-04 DIAGNOSIS — Z01810 Encounter for preprocedural cardiovascular examination: Secondary | ICD-10-CM | POA: Insufficient documentation

## 2018-05-04 DIAGNOSIS — Z01812 Encounter for preprocedural laboratory examination: Secondary | ICD-10-CM | POA: Insufficient documentation

## 2018-05-04 DIAGNOSIS — I10 Essential (primary) hypertension: Secondary | ICD-10-CM | POA: Insufficient documentation

## 2018-05-04 HISTORY — DX: Unspecified osteoarthritis, unspecified site: M19.90

## 2018-05-04 HISTORY — DX: Pneumonia, unspecified organism: J18.9

## 2018-05-04 HISTORY — DX: Personal history of urinary calculi: Z87.442

## 2018-05-04 NOTE — Patient Instructions (Signed)
  Your procedure is scheduled on: Tuesday May 10, 2018 Report to Same Day Surgery 2nd floor medical mall ([place] Entrance-take elevator on left to 2nd floor.  Check in with surgery information desk.) To find out your arrival time please call [phone] between 1PM - 3PM on May 09, 2018  Remember: Instructions that are not followed completely may result in serious medical risk, up to and including death, or upon the discretion of your surgeon and anesthesiologist your surgery may need to be rescheduled.    _x___ 1. Do not eat food after midnight the night before your procedure. You may drink clear liquids up to 2 hours before you are scheduled to arrive at the hospital for your procedure.  Do not drink clear liquids within 2 hours of your scheduled arrival to the hospital.  Clear liquids include  --Water or Apple juice without pulp  --Clear carbohydrate beverage such as Gatorade  --Black Coffee or Clear Tea (No milk, no creamers, do not add anything to the coffee or tea   No gum chewing or hard candies.      __x__ 2. No Alcohol for 24 hours before or after surgery.   __x__3. No Smoking or e-cigarettes for 24 prior to surgery.  Do not use any chewable tobacco products for at least 6 hour prior to surgery   ____  4. Bring all medications with you on the day of surgery if instructed.    __x__ 5. Notify your doctor if there is any change in your medical condition     (cold, fever, infections).   __x__6. On the morning of surgery brush your teeth with toothpaste and water.  You may rinse your mouth with mouth wash if you wish.  Do not swallow any toothpaste or mouthwash.   Do not wear jewelry.  Do not wear lotions, powders, deodorant, or perfumes.   Do not shave 48 hours prior to surgery. Men can shave their face and neck.   Do not bring valuables to the hospital.    [facility] is not responsible for any belongings or valuables.               Contacts, dentures or bridgework  may not be worn into surgery.  Please read over the following fact sheets that you were given:   [facility] Preparing for Surgery and or MRSA Information   _x___ Take anti-hypertensive listed below, cardiac, seizure, asthma, anti-reflux and psychiatric medicines. These include:  1. Percocet if needed for pain  2. Zofran if needed for nausea  Do not take lisinopril on the day of surgery.    _____ Use CHG Soap or sage wipes as directed on instruction sheet   _x___ Follow recommendations from Cardiologist, Pulmonologist or PCP regarding stopping Aspirin, Coumadin, Plavix ,Eliquis, Effient, or Pradaxa, and Pletal.  _x___Stop Anti-inflammatories such as Advil, Aleve, Ibuprofen, Motrin, Naproxen, Naprosyn, Goodies powders or aspirin products. OK to take Tylenol and Celebrex.   _x___ Stop supplements until after surgery.  But may continue Vitamin D, Vitamin B, and multivitamin.

## 2018-05-05 LAB — URINE CULTURE: Culture: NO GROWTH

## 2018-05-10 ENCOUNTER — Encounter
Admission: RE | Disposition: A | Discharge status: Home / Self Care | Payer: Self-pay | Source: Ambulatory Visit | Attending: Urology

## 2018-05-10 ENCOUNTER — Ambulatory Visit
Admission: RE | Admit: 2018-05-10 | Discharge: 2018-05-10 | Disposition: A | Discharge status: Home / Self Care | Payer: Medicare Other | Source: Ambulatory Visit | Attending: Urology | Admitting: Urology

## 2018-05-10 ENCOUNTER — Ambulatory Visit: Payer: Medicare Other | Admitting: Certified Registered"

## 2018-05-10 DIAGNOSIS — Z79899 Other long term (current) drug therapy: Secondary | ICD-10-CM | POA: Insufficient documentation

## 2018-05-10 DIAGNOSIS — M1990 Unspecified osteoarthritis, unspecified site: Secondary | ICD-10-CM | POA: Insufficient documentation

## 2018-05-10 DIAGNOSIS — Z8614 Personal history of Methicillin resistant Staphylococcus aureus infection: Secondary | ICD-10-CM | POA: Diagnosis not present

## 2018-05-10 DIAGNOSIS — Z880 Allergy status to penicillin: Secondary | ICD-10-CM | POA: Insufficient documentation

## 2018-05-10 DIAGNOSIS — Z8546 Personal history of malignant neoplasm of prostate: Secondary | ICD-10-CM | POA: Insufficient documentation

## 2018-05-10 DIAGNOSIS — Z7982 Long term (current) use of aspirin: Secondary | ICD-10-CM | POA: Insufficient documentation

## 2018-05-10 DIAGNOSIS — N201 Calculus of ureter: Secondary | ICD-10-CM

## 2018-05-10 DIAGNOSIS — Z85828 Personal history of other malignant neoplasm of skin: Secondary | ICD-10-CM | POA: Insufficient documentation

## 2018-05-10 DIAGNOSIS — E785 Hyperlipidemia, unspecified: Secondary | ICD-10-CM | POA: Diagnosis not present

## 2018-05-10 DIAGNOSIS — N132 Hydronephrosis with renal and ureteral calculous obstruction: Secondary | ICD-10-CM | POA: Diagnosis not present

## 2018-05-10 DIAGNOSIS — I10 Essential (primary) hypertension: Secondary | ICD-10-CM | POA: Diagnosis not present

## 2018-05-10 HISTORY — PX: CYSTOSCOPY WITH URETEROSCOPY, STONE BASKETRY AND STENT PLACEMENT: SHX6378

## 2018-05-10 SURGERY — CYSTOSCOPY, WITH CALCULUS MANIPULATION OR REMOVAL
Anesthesia: General | Laterality: Right | Wound class: Clean Contaminated

## 2018-05-10 MED ORDER — FENTANYL CITRATE (PF) 100 MCG/2ML IJ SOLN
INTRAMUSCULAR | Status: DC | PRN
  Administered 2018-05-10 (×2): 50 ug via INTRAVENOUS

## 2018-05-10 MED ORDER — PROPOFOL 10 MG/ML IV BOLUS
INTRAVENOUS | Status: DC | PRN
  Administered 2018-05-10: 20 mg via INTRAVENOUS
  Administered 2018-05-10: 180 mg via INTRAVENOUS

## 2018-05-10 MED ORDER — FAMOTIDINE 20 MG PO TABS
20.0000 mg | ORAL_TABLET | Freq: Once | ORAL | Status: AC
  Administered 2018-05-10: 20 mg via ORAL

## 2018-05-10 MED ORDER — LIDOCAINE HCL (CARDIAC) PF 100 MG/5ML IV SOSY
PREFILLED_SYRINGE | INTRAVENOUS | Status: DC | PRN
  Administered 2018-05-10: 80 mg via INTRAVENOUS

## 2018-05-10 MED ORDER — LIDOCAINE HCL (PF) 2 % IJ SOLN
INTRAMUSCULAR | Status: AC
  Filled 2018-05-10: qty 10

## 2018-05-10 MED ORDER — IOTHALAMATE MEGLUMINE 43 % IV SOLN
INTRAVENOUS | Status: DC | PRN
  Administered 2018-05-10: 10 mL

## 2018-05-10 MED ORDER — ONDANSETRON HCL 4 MG/2ML IJ SOLN: 4.0000 mg | Freq: Once | INTRAMUSCULAR | Status: DC | PRN

## 2018-05-10 MED ORDER — CEFAZOLIN SODIUM-DEXTROSE 2-4 GM/100ML-% IV SOLN
2.0000 g | INTRAVENOUS | Status: AC
  Administered 2018-05-10: 2 g via INTRAVENOUS

## 2018-05-10 MED ORDER — CEFAZOLIN SODIUM-DEXTROSE 2-4 GM/100ML-% IV SOLN
INTRAVENOUS | Status: AC
  Filled 2018-05-10: qty 100

## 2018-05-10 MED ORDER — DEXAMETHASONE SODIUM PHOSPHATE 10 MG/ML IJ SOLN
INTRAMUSCULAR | Status: DC | PRN
Start: 2018-05-10 — End: 2018-05-10
  Administered 2018-05-10: 5 mg via INTRAVENOUS

## 2018-05-10 MED ORDER — FENTANYL CITRATE (PF) 100 MCG/2ML IJ SOLN
INTRAMUSCULAR | Status: AC
  Filled 2018-05-10: qty 2

## 2018-05-10 MED ORDER — DEXAMETHASONE SODIUM PHOSPHATE 10 MG/ML IJ SOLN
INTRAMUSCULAR | Status: AC
  Filled 2018-05-10: qty 1

## 2018-05-10 MED ORDER — PROPOFOL 10 MG/ML IV BOLUS
INTRAVENOUS | Status: AC
  Filled 2018-05-10: qty 20

## 2018-05-10 MED ORDER — ONDANSETRON HCL 4 MG/2ML IJ SOLN
INTRAMUSCULAR | Status: DC | PRN
  Administered 2018-05-10: 4 mg via INTRAVENOUS

## 2018-05-10 MED ORDER — FENTANYL CITRATE (PF) 100 MCG/2ML IJ SOLN: 25.0000 ug | INTRAMUSCULAR | Status: DC | PRN

## 2018-05-10 MED ORDER — ONDANSETRON HCL 4 MG/2ML IJ SOLN
INTRAMUSCULAR | Status: AC
  Filled 2018-05-10: qty 2

## 2018-05-10 MED ORDER — LACTATED RINGERS IV SOLN
INTRAVENOUS | Status: DC
  Administered 2018-05-10: 10:00:00 via INTRAVENOUS

## 2018-05-10 MED ORDER — FAMOTIDINE 20 MG PO TABS
ORAL_TABLET | ORAL | Status: AC
  Administered 2018-05-10: 20 mg via ORAL
  Filled 2018-05-10: qty 1

## 2018-05-10 SURGICAL SUPPLY — 27 items
BAG DRAIN CYSTO-URO LG1000N (MISCELLANEOUS) ×3 IMPLANT
BASKET ZERO TIP 1.9FR (BASKET) ×3 IMPLANT
BRUSH SCRUB EZ 1% IODOPHOR (MISCELLANEOUS) ×3 IMPLANT
CATH URETL 5X70 OPEN END (CATHETERS) ×3 IMPLANT
CNTNR SPEC 2.5X3XGRAD LEK (MISCELLANEOUS)
CONRAY 43 FOR UROLOGY 50M (MISCELLANEOUS) ×3 IMPLANT
CONT SPEC 4OZ STER OR WHT (MISCELLANEOUS)
CONTAINER SPEC 2.5X3XGRAD LEK (MISCELLANEOUS) IMPLANT
DRAPE UTILITY 15X26 TOWEL STRL (DRAPES) ×3 IMPLANT
FIBER LASER LITHO 273 (Laser) IMPLANT
GLOVE BIO SURGEON STRL SZ8 (GLOVE) ×3 IMPLANT
GOWN STRL REUS W/ TWL LRG LVL3 (GOWN DISPOSABLE) ×3 IMPLANT
GOWN STRL REUS W/TWL LRG LVL3 (GOWN DISPOSABLE) ×6
INFUSOR MANOMETER BAG 3000ML (MISCELLANEOUS) ×3 IMPLANT
INTRODUCER DILATOR DOUBLE (INTRODUCER) IMPLANT
KIT TURNOVER CYSTO (KITS) ×3 IMPLANT
PACK CYSTO AR (MISCELLANEOUS) ×3 IMPLANT
SENSORWIRE 0.038 NOT ANGLED (WIRE) ×3
SET CYSTO W/LG BORE CLAMP LF (SET/KITS/TRAYS/PACK) ×3 IMPLANT
SHEATH URETERAL 12FRX35CM (MISCELLANEOUS) IMPLANT
SOL .9 NS 3000ML IRR  AL (IV SOLUTION) ×2
SOL .9 NS 3000ML IRR UROMATIC (IV SOLUTION) ×1 IMPLANT
STENT URET 6FRX24 CONTOUR (STENTS) IMPLANT
STENT URET 6FRX26 CONTOUR (STENTS) IMPLANT
SURGILUBE 2OZ TUBE FLIPTOP (MISCELLANEOUS) ×3 IMPLANT
WATER STERILE IRR 1000ML POUR (IV SOLUTION) ×3 IMPLANT
WIRE SENSOR 0.038 NOT ANGLED (WIRE) ×1 IMPLANT

## 2018-05-10 NOTE — Anesthesia Post-op Follow-up Note (Signed)
Anesthesia QCDR form completed.        

## 2018-05-10 NOTE — Anesthesia Procedure Notes (Signed)
Procedure Name: LMA Insertion Date/Time: 05/10/2018 10:26 AM Performed by: Chanetta Marshall, CRNA Pre-anesthesia Checklist: Patient identified Patient Re-evaluated:Patient Re-evaluated prior to induction Oxygen Delivery Method: Circle system utilized Preoxygenation: Pre-oxygenation with 100% oxygen Induction Type: IV induction Ventilation: Mask ventilation without difficulty LMA Size: 4.0 Number of attempts: 1 Placement Confirmation: positive ETCO2,  CO2 detector and breath sounds checked- equal and bilateral Dental Injury: Teeth and Oropharynx as per pre-operative assessment

## 2018-05-10 NOTE — Discharge Instructions (Signed)

## 2018-05-10 NOTE — Transfer of Care (Signed)
Immediate Anesthesia Transfer of Care Note  Patient: Dalton Dunn  Procedure(s) Performed: CYSTOSCOPY WITH URETEROSCOPY, STONE BASKETRY (Right )  Patient Location: PACU  Anesthesia Type:General  Level of Consciousness: awake  Airway & Oxygen Therapy: Patient Spontanous Breathing and Patient connected to face mask oxygen  Post-op Assessment: Report given to RN and Post -op Vital signs reviewed and stable  Post vital signs: Reviewed and stable  Last Vitals:  Vitals Value Taken Time  BP    Temp    Pulse 85 05/10/2018 11:00 AM  Resp 15 05/10/2018 11:00 AM  SpO2 100 % 05/10/2018 11:00 AM  Vitals shown include unvalidated device data.  Last Pain: There were no vitals filed for this visit.       Complications: No apparent anesthesia complications

## 2018-05-10 NOTE — Anesthesia Preprocedure Evaluation (Addendum)
Anesthesia Evaluation  Patient identified by MRN, date of birth, ID band Patient awake    Reviewed: Allergy & Precautions, NPO status , Patient's Chart, lab work & pertinent test results  Airway Mallampati: II  TM Distance: <3 FB     Dental   Pulmonary pneumonia, resolved,           Cardiovascular hypertension, Pt. on medications      Neuro/Psych negative neurological ROS  negative psych ROS   GI/Hepatic negative GI ROS, Neg liver ROS,   Endo/Other  negative endocrine ROS  Renal/GU      Musculoskeletal  (+) Arthritis , Osteoarthritis,    Abdominal   Peds negative pediatric ROS (+)  Hematology negative hematology ROS (+)   Anesthesia Other Findings Past Medical History: No date: Arthritis [phone]: Cancer ([place])     Comment:  skin  No date: History of kidney stones No date: Hyperlipidemia No date: Hypertension 2003: MRSA (methicillin resistant staph aureus) culture positive 2013: Pneumonia No date: Prostate cancer ([place])  Reproductive/Obstetrics                            Anesthesia Physical Anesthesia Plan  ASA: II  Anesthesia Plan: General   Post-op Pain Management:    Induction: Intravenous  PONV Risk Score and Plan:   Airway Management Planned: LMA  Additional Equipment:   Intra-op Plan:   Post-operative Plan: Extubation in OR  Informed Consent: I have reviewed the patients History and Physical, chart, labs and discussed the procedure including the risks, benefits and alternatives for the proposed anesthesia with the patient or authorized representative who has indicated his/her understanding and acceptance.   Dental advisory given  Plan Discussed with: CRNA and Surgeon  Anesthesia Plan Comments:        Anesthesia Quick Evaluation

## 2018-05-10 NOTE — Anesthesia Postprocedure Evaluation (Signed)
Anesthesia Post Note  Patient: Dalton Dunn  Procedure(s) Performed: CYSTOSCOPY WITH URETEROSCOPY, STONE BASKETRY (Right )  Patient location during evaluation: PACU Anesthesia Type: General Level of consciousness: awake and alert and oriented Pain management: pain level controlled Vital Signs Assessment: post-procedure vital signs reviewed and stable Respiratory status: spontaneous breathing Cardiovascular status: blood pressure returned to baseline Anesthetic complications: no     Last Vitals:  Vitals:   05/10/18 1156 05/10/18 1210  BP: (!) 160/87 (!) 166/81  Pulse: (!) 55 (!) 54  Resp: 13 16  Temp:  36.7 C  SpO2: 96% 98%    Last Pain:  Vitals:   05/10/18 1210  TempSrc: Tympanic  PainSc: 3                  [place]

## 2018-05-10 NOTE — Op Note (Signed)
Preoperative diagnosis: Right distal ureteral calculus  Postoperative diagnosis: Right distal ureteral calculus  Procedure:  1. Cystoscopy 2. Right ureteroscopy and stone removal 3. Right retrograde pyelography with interpretation  Surgeon: Nicki Reaper C. Stoioff, M.D.  Anesthesia: General  Complications: None  Intraoperative findings:  1.  Right retrograde pyelography post procedure showed no filling defects, stone fragments or contrast extravasation.  There was prompt drainage of contrast from the ureter and collecting system on real-time fluoroscopy.  EBL: Minimal  Specimens: 1. Calculus for analysis   Indication: Dalton Dunn is a 67 y.o. year old patient who presented to the ED on 03/29/2018 with right renal colic and a CT showed a 3 mm right distal ureteral calculus.  He continues with intermittent symptoms and has been unable to pass the stone.  After reviewing the management options for treatment, the patient elected to proceed with the above surgical procedure(s). We have discussed the potential benefits and risks of the procedure, side effects of the proposed treatment, the likelihood of the patient achieving the goals of the procedure, and any potential problems that might occur during the procedure or recuperation. Informed consent has been obtained.  Description of procedure:  The patient was taken to the operating room and general anesthesia was induced.  The patient was placed in the dorsal lithotomy position, prepped and draped in the usual sterile fashion, and preoperative antibiotics were administered. A preoperative time-out was performed.   A 22 French cystoscope was lubricated and passed under direct vision.  The urethra was normal in caliber without stricture.  The prostate was surgically absent.  The verumontanum was present.  Panendoscopy was performed and the bladder mucosa showed no erythema, solid or papillary lesions.  Attention was directed to the right  ureteral orifice and a 0.038 Sensor wire was then advanced up the ureter into the renal pelvis under fluoroscopic guidance.  A 4.5 Fr semirigid ureteroscope was then advanced into the ureter next to the guidewire and the calculus was identified in the distal ureter.   The stone was captured and a 1.9 [place] nitinol basket and removed without difficulty.  The ureteroscope was repassed and advanced to the proximal ureter.  No additional calculi or fragments were noted.  No significant bleeding was noted and a minimal edema was present at the site of stone removal.  The guidewire was removed and a pullout retrograde pyelogram was performed through the ureteroscope with findings as described above.  Based on these findings it was elected not to place a ureteral stent.  All instruments were removed.  The patient appeared to tolerate the procedure well and without complications.  After anesthetic reversal the patient was transported to the PACU in stable condition.    Scott C. Bernardo Heater, MD

## 2018-05-10 NOTE — Interval H&P Note (Signed)
History and Physical Interval Note:  05/10/2018 10:06 AM  Dalton Dunn  has presented today for surgery, with the diagnosis of right ureteral stone  The various methods of treatment have been discussed with the patient and family. After consideration of risks, benefits and other options for treatment, the patient has consented to  Procedure(s): CYSTOSCOPY WITH URETEROSCOPY, STONE BASKETRY AND STENT PLACEMENT (Right) CYSTOSCOPY WITH HOLMIUM LASER LITHOTRIPSY (Right) as a surgical intervention .  The patient's history has been reviewed, patient examined, no change in status, stable for surgery.  I have reviewed the patient's chart and labs.  Questions were answered to the patient's satisfaction.    He is still symptomatic and not aware of passing a stone.   [place]

## 2018-05-11 ENCOUNTER — Encounter: Payer: Self-pay | Admitting: Urology

## 2018-05-12 LAB — STONE ANALYSIS
Ca Oxalate,Dihydrate: 35 %
Ca Oxalate,Monohydr.: 60 %
Ca phos cry stone ql IR: 5 %
STONE WEIGHT KSTONE: 16 mg

## 2018-06-13 ENCOUNTER — Ambulatory Visit (INDEPENDENT_AMBULATORY_CARE_PROVIDER_SITE_OTHER): Payer: Medicare Other | Admitting: Urology

## 2018-06-13 ENCOUNTER — Encounter: Payer: Self-pay | Admitting: Urology

## 2018-06-13 VITALS — BP 157/91 | HR 85 | Ht 66.0 in | Wt 204.8 lb

## 2018-06-13 DIAGNOSIS — Z87442 Personal history of urinary calculi: Secondary | ICD-10-CM | POA: Diagnosis not present

## 2018-06-13 DIAGNOSIS — Z8546 Personal history of malignant neoplasm of prostate: Secondary | ICD-10-CM

## 2018-06-13 NOTE — Progress Notes (Signed)
06/13/2018 12:21 PM   Dalton Dunn July 28, 1951 [id number]  Referring provider: Albina Billet, MD [address]   [place], [place] [address]  Chief Complaint  Patient presents with  . Routine Post Op    HPI: 67 year old male presents for postop follow-up.  He underwent basket extraction of a 3 mm distal ureteral calculus on 05/10/2018 after failure to pass.  He did not have a stent postop and had no problems.  He has no complaints today.  He recently underwent shoulder surgery and is doing well.  Stone analysis was mixed calcium oxalate.  He has a previous history of stone disease however has been over 30 years since his last episode.  His CT showed no other calculi.   PMH: Past Medical History:  Diagnosis Date  . Arthritis   . Cancer ([place]) [phone]   skin   . History of kidney stones   . Hyperlipidemia   . Hypertension   . MRSA (methicillin resistant staph aureus) culture positive 2003  . Pneumonia 2013  . Prostate cancer [facility])     Surgical History: Past Surgical History:  Procedure Laterality Date  . COLONOSCOPY  2005  . CYSTOSCOPY WITH URETEROSCOPY, STONE BASKETRY AND STENT PLACEMENT Right 05/10/2018   Procedure: CYSTOSCOPY WITH URETEROSCOPY, STONE BASKETRY;  Surgeon: Abbie Sons, MD;  Location: [facility] ORS;  Service: Urology;  Laterality: Right;  . KNEE ARTHROSCOPY Right 1982, 1995  . PROSTATE SURGERY    . SHOULDER ARTHROSCOPY Right 1990  . TONSILLECTOMY      Home Medications:  Allergies as of 06/13/2018      Reactions   Penicillins Rash   Has patient had a PCN reaction causing immediate rash, facial/tongue/throat swelling, SOB or lightheadedness with hypotension: Yes Has patient had a PCN reaction causing severe rash involving mucus membranes or skin necrosis: No Has patient had a PCN reaction that required hospitalization: No Has patient had a PCN reaction occurring within the last 10 years: No If all of the above answers are "NO", then may  proceed with Cephalosporin use.      Medication List        Accurate as of 06/13/18 12:21 PM. Always use your most recent med list.          aspirin EC 81 MG tablet Take 81 mg by mouth daily.   atorvastatin 10 MG tablet Commonly known as:  LIPITOR Take 10 mg by mouth daily at 6 PM.   betamethasone valerate 0.1 % cream Commonly known as:  VALISONE Apply 1 application topically daily as needed (inflammartion).   CENTRUM SILVER ADULT 50+ Tabs Take 1 tablet by mouth daily.   cholecalciferol 1000 units tablet Commonly known as:  VITAMIN D Take 1,000 Units by mouth daily.   diclofenac sodium 1 % Gel Commonly known as:  VOLTAREN Apply 2 g topically 2 (two) times daily.   doxycycline 100 MG tablet Commonly known as:  VIBRA-TABS doxycycline hyclate 100 mg tablet   fluticasone 50 MCG/ACT nasal spray Commonly known as:  FLONASE Place 2 sprays into both nostrils daily as needed for allergies or rhinitis.   ibuprofen 800 MG tablet Commonly known as:  ADVIL,MOTRIN Take 1 tablet (800 mg total) by mouth every 8 (eight) hours as needed.   lisinopril 20 MG tablet Commonly known as:  PRINIVIL,ZESTRIL Take 20 mg by mouth daily.   loratadine 10 MG tablet Commonly known as:  CLARITIN Take 10 mg by mouth daily as needed for allergies.  sildenafil 20 MG tablet Commonly known as:  REVATIO Take 20 mg by mouth 3 (three) times daily. Take 3 to 5 tablets po daily as needed   triamcinolone cream 0.1 % Commonly known as:  KENALOG       Allergies:  Allergies  Allergen Reactions  . Penicillins Rash    Has patient had a PCN reaction causing immediate rash, facial/tongue/throat swelling, SOB or lightheadedness with hypotension: Yes Has patient had a PCN reaction causing severe rash involving mucus membranes or skin necrosis: No Has patient had a PCN reaction that required hospitalization: No Has patient had a PCN reaction occurring within the last 10 years: No If all of the above  answers are "NO", then may proceed with Cephalosporin use.    Family History: No family history on file.  Social History:  reports that he has never smoked. He has never used smokeless tobacco. He reports that he drinks alcohol. He reports that he does not use drugs.  ROS: UROLOGY Frequent Urination?: No Hard to postpone urination?: No Burning/pain with urination?: No Get up at night to urinate?: No Leakage of urine?: No Urine stream starts and stops?: No Trouble starting stream?: No Do you have to strain to urinate?: No Blood in urine?: No Urinary tract infection?: No Sexually transmitted disease?: No Injury to kidneys or bladder?: No Painful intercourse?: No Weak stream?: No Erection problems?: No Penile pain?: No  Gastrointestinal Nausea?: No Vomiting?: No Indigestion/heartburn?: No Diarrhea?: No Constipation?: No  Constitutional Fever: No Night sweats?: No Weight loss?: No Fatigue?: No  Skin Skin rash/lesions?: No Itching?: No  Eyes Blurred vision?: No Double vision?: No  Ears/Nose/Throat Sore throat?: No Sinus problems?: No  Hematologic/Lymphatic Swollen glands?: No Easy bruising?: No  Cardiovascular Leg swelling?: No Chest pain?: No  Respiratory Cough?: No Shortness of breath?: No  Endocrine Excessive thirst?: No  Musculoskeletal Back pain?: No Joint pain?: No  Neurological Headaches?: No Dizziness?: No  Psychologic Depression?: No Anxiety?: No  Physical Exam: BP (!) 157/91 (BP Location: Left Arm, Patient Position: Sitting, Cuff Size: Large)   Pulse 85   Ht 5\' 6"  (1.676 m)   Wt 204 lb 12.8 oz (92.9 kg)   BMI 33.06 kg/m   Constitutional:  Alert and oriented, No acute distress.    Assessment & Plan:   Doing well status post ureteroscopic stone removal.  We discussed proceeding with a metabolic evaluation versus general stone prevention guidelines.  Since he has no other calculi and it has been over 30 years since his last  stone he elected the latter.  It was recommended he increase his water intake keep urine output greater than 2 L/day.  Dietary oxalate moderation, low-sodium diet and intake of citrus beverages, primary lemonade was discussed.  Follow-up April 2020 for annual prostate cancer visit and will obtain a KUB at that time.    Abbie Sons, [place] [address], [place], [place] [address] [phone]

## 2019-03-13 ENCOUNTER — Ambulatory Visit: Payer: Medicare Other | Admitting: Urology

## 2019-03-15 ENCOUNTER — Other Ambulatory Visit: Payer: Self-pay

## 2019-03-15 ENCOUNTER — Telehealth: Payer: Medicare Other | Admitting: Urology

## 2019-03-16 ENCOUNTER — Ambulatory Visit
Admission: RE | Admit: 2019-03-16 | Discharge: 2019-03-16 | Disposition: A | Discharge status: Home / Self Care | Payer: Medicare Other | Source: Ambulatory Visit | Attending: Urology | Admitting: Urology

## 2019-03-16 ENCOUNTER — Other Ambulatory Visit: Payer: Medicare Other

## 2019-03-16 ENCOUNTER — Other Ambulatory Visit: Payer: Self-pay

## 2019-03-16 DIAGNOSIS — Z8546 Personal history of malignant neoplasm of prostate: Secondary | ICD-10-CM

## 2019-03-16 DIAGNOSIS — Z87442 Personal history of urinary calculi: Secondary | ICD-10-CM

## 2019-03-17 LAB — PSA: Prostate Specific Ag, Serum: <0.1 ng/mL (ref 0.0–4.0)

## 2019-03-22 ENCOUNTER — Other Ambulatory Visit: Payer: Self-pay

## 2019-03-22 ENCOUNTER — Telehealth (INDEPENDENT_AMBULATORY_CARE_PROVIDER_SITE_OTHER): Payer: Medicare Other | Admitting: Urology

## 2019-03-22 DIAGNOSIS — Z8546 Personal history of malignant neoplasm of prostate: Secondary | ICD-10-CM

## 2019-03-22 DIAGNOSIS — Z87442 Personal history of urinary calculi: Secondary | ICD-10-CM

## 2019-03-22 NOTE — Progress Notes (Signed)
Virtual Visit via Video Note  I connected with Dalton Dunn on 03/22/19 at 11:30 AM EDT by a video enabled telemedicine application and verified that I am speaking with the correct person using two identifiers.  Location: Patient: Home Provider: Private home office   I discussed the limitations of evaluation and management by telemedicine and the availability of in person appointments. The patient expressed understanding and agreed to proceed.  History of Present Illness: Urologic history:  1. 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 Alinda Money in [place]  -Undetectable PSA postop  2.  History urinary calculi  -Ureteroscopic removal 3 mm R distal ureteral calculus 03/5273   68 year old male for follow-up of the above problem list.  A telehealth visit is performed secondary to COVID-19 pandemic.  Since his last visit in July 2019 he states he has been doing well.  He has no bothersome lower urinary tract symptoms.  Denies dysuria, gross hematuria or flank/abdominal/pelvic/scrotal pain.  PSA drawn on 03/16/2019 remains undetectable at <0.1  KUB performed on 03/16/2019 was personally reviewed and there are no calcifications suspicious for urinary tract stones.  Observations/Objective: Alert, in no acute distress  Assessment and Plan: 68 year old male with history of stone disease and prostate cancer.  PSA remains undetectable.  No evidence of recurrent urinary tract calculi.  Follow Up Instructions: Follow-up 1 year for PSA/KUB.   I discussed the assessment and treatment plan with the patient. The patient was provided an opportunity to ask questions and all were answered. The patient agreed with the plan and demonstrated an understanding of the instructions.   The patient was advised to call back or seek an in-person evaluation if the symptoms worsen or if the condition fails to improve as anticipated.  I provided 10 minutes of non-face-to-face time  during this encounter.   Abbie Sons, MD

## 2020-02-14 IMAGING — CT CT RENAL STONE PROTOCOL
2 of 4 series · 15 of 46 positions shown, 17 images · non-contrast
Comparison: MR pelvis February 21, 2010

CLINICAL DATA: Pelvic region pain.  History of prostate carcinoma

EXAM:
CT ABDOMEN AND PELVIS WITHOUT CONTRAST
TECHNIQUE: Multidetector CT imaging of the abdomen and pelvis was performed
following the standard protocol without oral or IV contrast.

[Series 2: stone full standard · axial · 0.67mm/px · z∈[-1214,-814]mm · 12 of 88 slices shown, 14 images]
[im 4/88  soft-tissue]
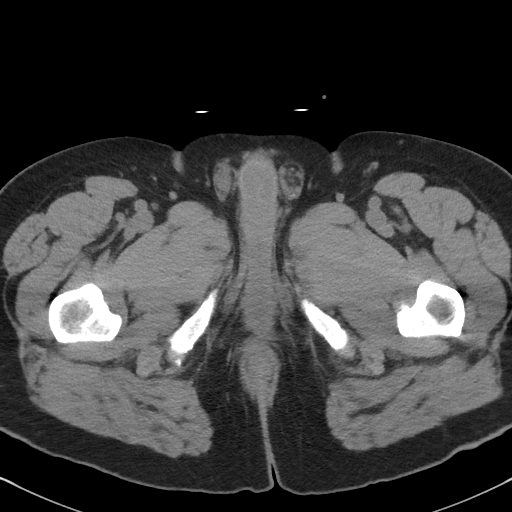
[im 4/88  bone]
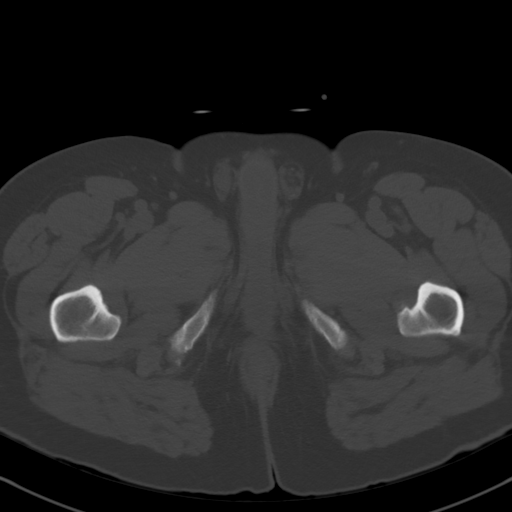
[im 11/88  soft-tissue]
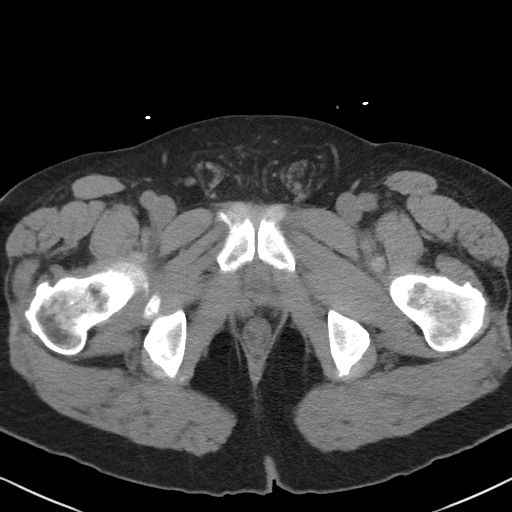
[im 19/88  soft-tissue]
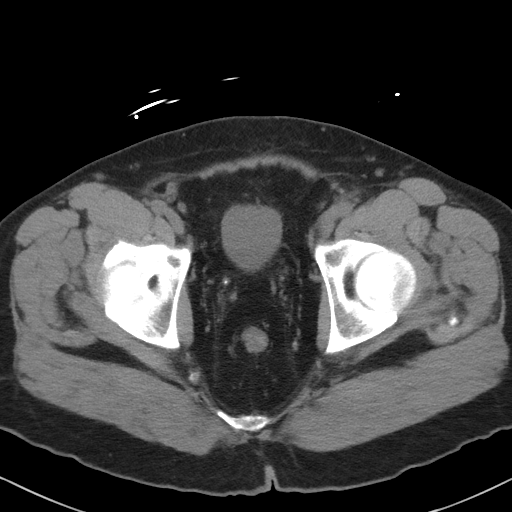
[im 26/88  soft-tissue]
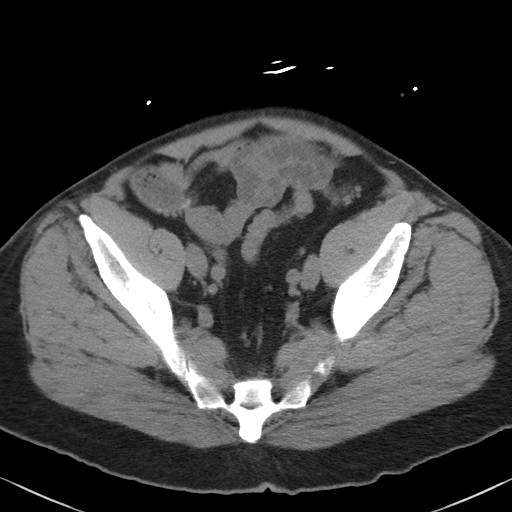
[im 33/88  soft-tissue]
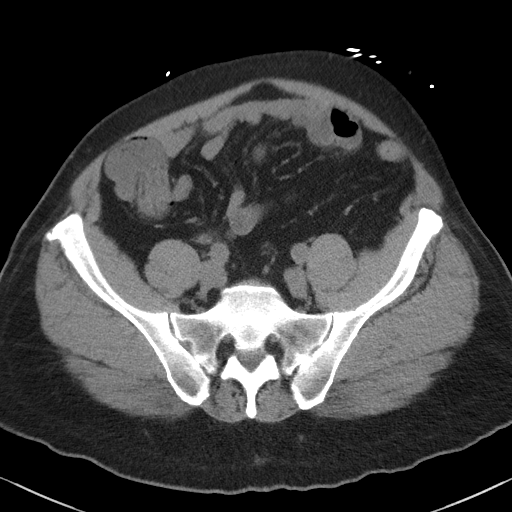
[im 40/88  soft-tissue]
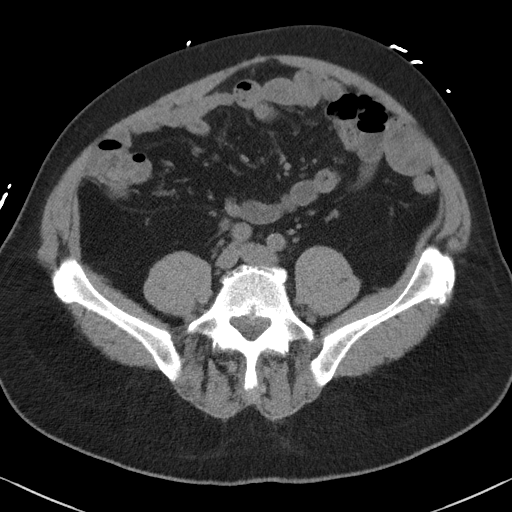
[im 48/88  soft-tissue]
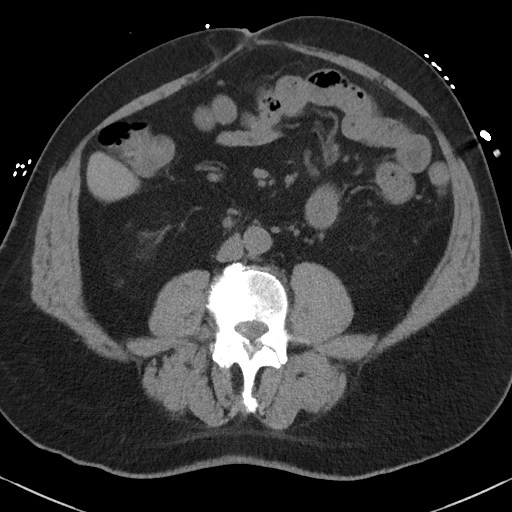
[im 55/88  soft-tissue]
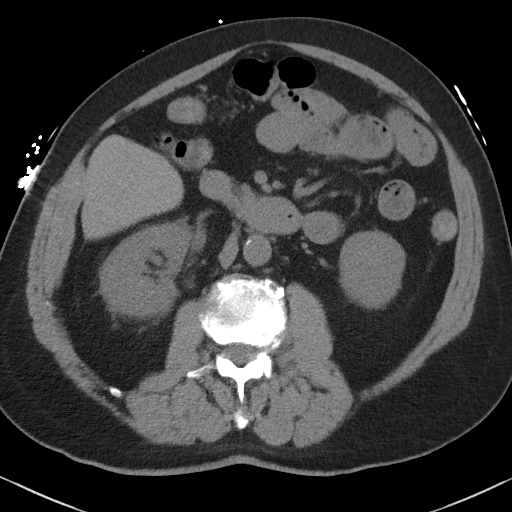
[im 62/88  soft-tissue]
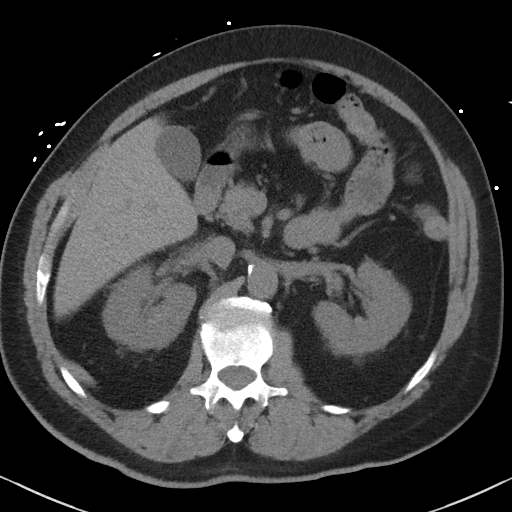
[im 62/88  bone]
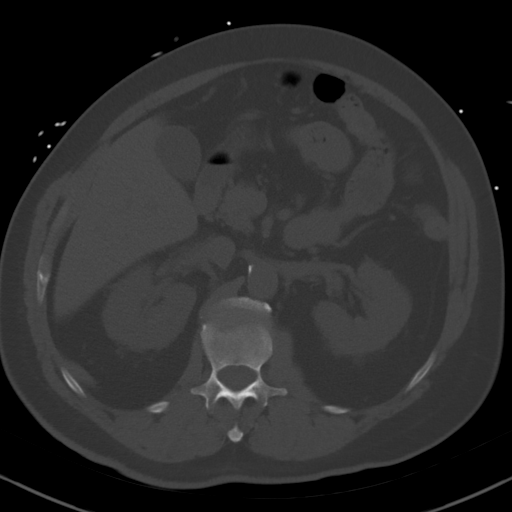
[im 69/88  soft-tissue]
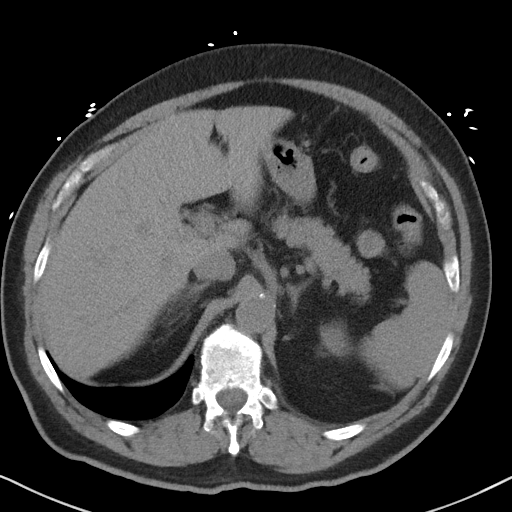
[im 77/88  soft-tissue]
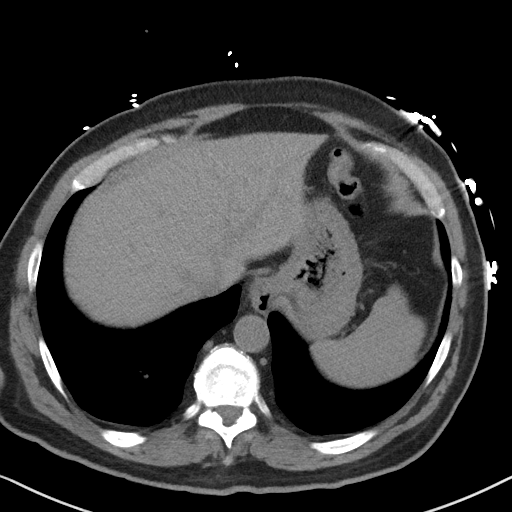
[im 84/88  soft-tissue]
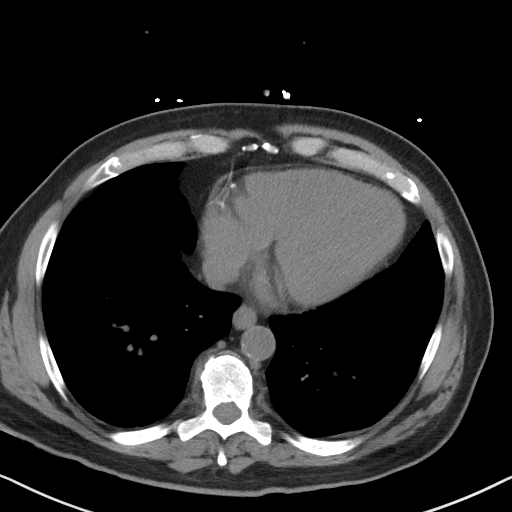

[Series 5: coronal · coronal · 0.77mm/px · 3 of 151 slices shown]
[im 51/151  soft-tissue]
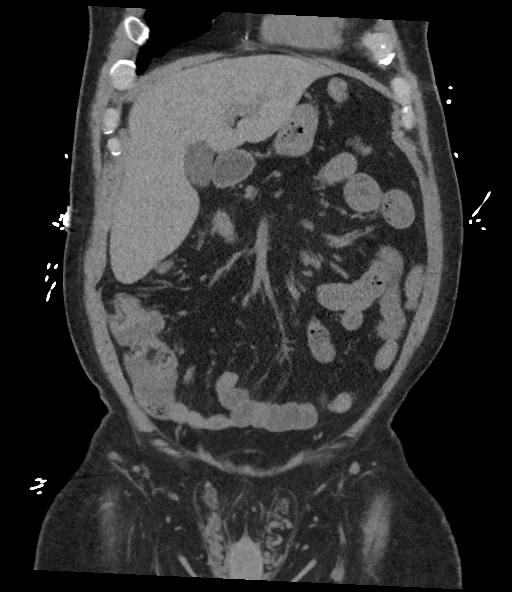
[im 67/151  soft-tissue]
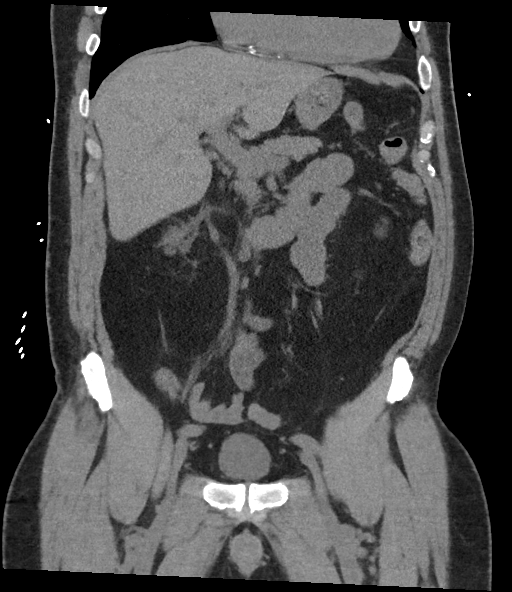
[im 84/151  soft-tissue]
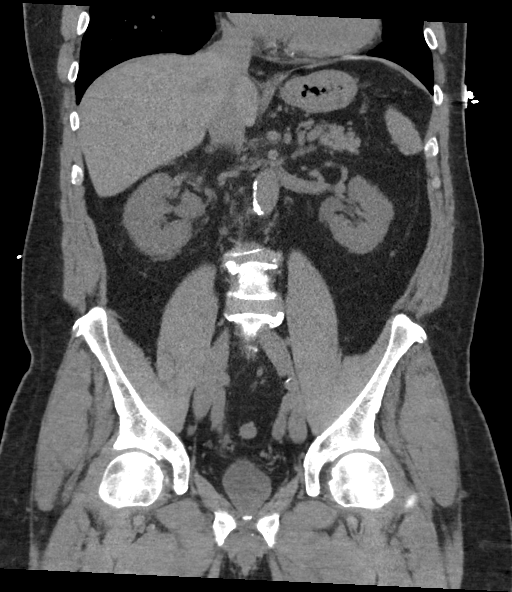

[15 of 46 positions shown; findings below may reference images not displayed]

FINDINGS: Lower chest: Lung bases are clear. There are foci of coronary artery
calcification. There is a small hiatal hernia.

Hepatobiliary: No focal liver lesions are appreciable on this
noncontrast enhanced study. Gallbladder wall is not appreciably
thickened. There is no biliary duct dilatation.

Pancreas: There is no evident pancreatic mass or inflammatory focus.

Spleen: No splenic lesions are evident.

Adrenals/Urinary Tract: Adrenals bilaterally appear normal. Right
kidney is edematous. There is no renal mass on either side. There is
moderate hydronephrosis on the right. There is no appreciable
hydronephrosis on the left. There is no intrarenal calculus on
either side. There is edema of the right ureter to the level of a 3
mm calculus in the distal right ureter at the mid acetabular level
on the right. No other ureteral calculus is identified on either
side. Urinary bladder is midline with wall thickness within normal
limits.

Stomach/Bowel: There is no appreciable bowel wall or mesenteric
thickening. No evident bowel obstruction. No free air or portal
venous air.

Vascular/Lymphatic: There is atherosclerotic calcification in the
aorta and common iliac arteries. No aneurysm demonstrable. Major
mesenteric arterial vessels appear patent on this noncontrast
enhanced study. There is no appreciable adenopathy in the abdomen or
pelvis.

Reproductive: Prostate is absent.  No evident pelvic mass.

Other: Appendix not seen. No periappendiceal region inflammation. No
abscess or ascites is evident in the abdomen or pelvis. There is a
minimal ventral hernia containing only fat.

Musculoskeletal: There is extensive degenerative change in the
lumbar spine region. There is a partially calcified disc extrusion
on the right at L3-4. There are no evident blastic or lytic bone
lesions. There is no appreciable intramuscular or abdominal wall
lesion.
IMPRESSION: 1. 3 mm calculus distal right ureter at the level of the right
acetabulum. There is moderate hydronephrosis and ureterectasis
proximal to this calculus. There is right renal edema.

2.  Absent prostate.  No pelvic mass evident.

3. No bowel obstruction. No abscess. Appendix not seen. No
periappendiceal region inflammation.

4. Calcified disc extrusion on the right at L3-4 impressing on the
exiting nerve root on the right at this level.

5. Aortoiliac atherosclerosis. There is also coronary artery
calcification evident.

6.  Small hiatal hernia.  Small ventral hernia containing only fat.

Aortic Atherosclerosis (53H85-A1X.X).

## 2020-03-14 ENCOUNTER — Other Ambulatory Visit: Payer: Self-pay

## 2020-03-14 ENCOUNTER — Other Ambulatory Visit: Payer: Medicare PPO

## 2020-03-14 DIAGNOSIS — Z8546 Personal history of malignant neoplasm of prostate: Secondary | ICD-10-CM

## 2020-03-15 LAB — PSA: Prostate Specific Ag, Serum: <0.1 ng/mL (ref 0.0–4.0)

## 2020-03-21 ENCOUNTER — Encounter: Payer: Self-pay | Admitting: Urology

## 2020-03-21 ENCOUNTER — Ambulatory Visit (INDEPENDENT_AMBULATORY_CARE_PROVIDER_SITE_OTHER): Payer: Medicare PPO | Admitting: Urology

## 2020-03-21 ENCOUNTER — Other Ambulatory Visit: Payer: Self-pay

## 2020-03-21 VITALS — BP 148/79 | HR 97 | Ht 66.0 in | Wt 190.0 lb

## 2020-03-21 DIAGNOSIS — Z8546 Personal history of malignant neoplasm of prostate: Secondary | ICD-10-CM | POA: Diagnosis not present

## 2020-03-21 DIAGNOSIS — Z87442 Personal history of urinary calculi: Secondary | ICD-10-CM | POA: Insufficient documentation

## 2020-03-21 NOTE — Progress Notes (Signed)
03/21/2020 10:51 AM   Dalton Dunn 07/01/1951 [id number]  Referring provider: Albina Billet, MD [address]   [place],  [place] [address]  Chief Complaint  Patient presents with  . Follow-up    Urologic history: 1. 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 Alinda Money in [place] -Undetectable PSA postop  2.  History urinary calculi -Ureteroscopic removal 3 mm R distal ureteral calculus 04/2018   HPI: 69 y.o. male presents for follow-up.    -Doing well since last visit -Denies flank, abdominal, pelvic pain -No voiding symptoms or gross hematuria -PSA 03/14/2020 remains undetectable <0.1   PMH: Past Medical History:  Diagnosis Date  . Arthritis   . Cancer ([place]) [phone]   skin   . History of kidney stones   . Hyperlipidemia   . Hypertension   . MRSA (methicillin resistant staph aureus) culture positive 2003  . Pneumonia 2013  . Prostate cancer [facility])     Surgical History: Past Surgical History:  Procedure Laterality Date  . COLONOSCOPY  2005  . CYSTOSCOPY WITH URETEROSCOPY, STONE BASKETRY AND STENT PLACEMENT Right 05/10/2018   Procedure: CYSTOSCOPY WITH URETEROSCOPY, STONE BASKETRY;  Surgeon: Abbie Sons, MD;  Location: [facility] ORS;  Service: Urology;  Laterality: Right;  . KNEE ARTHROSCOPY Right 1982, 1995  . PROSTATE SURGERY    . SHOULDER ARTHROSCOPY Right 1990  . TONSILLECTOMY      Home Medications:  Allergies as of 03/21/2020      Reactions   Penicillins Rash   Has patient had a PCN reaction causing immediate rash, facial/tongue/throat swelling, SOB or lightheadedness with hypotension: Yes Has patient had a PCN reaction causing severe rash involving mucus membranes or skin necrosis: No Has patient had a PCN reaction that required hospitalization: No Has patient had a PCN reaction occurring within the last 10 years: No If all of the above answers are "NO", then may proceed with Cephalosporin use.      Medication  List       Accurate as of Mar 21, 2020 10:51 AM. If you have any questions, ask your nurse or doctor.        aspirin EC 81 MG tablet Take 81 mg by mouth daily.   atorvastatin 10 MG tablet Commonly known as: LIPITOR Take 10 mg by mouth daily at 6 PM.   betamethasone valerate 0.1 % cream Commonly known as: VALISONE Apply 1 application topically daily as needed (inflammartion).   Centrum Silver Adult 50+ Tabs Take 1 tablet by mouth daily.   cholecalciferol 1000 units tablet Commonly known as: VITAMIN D Take 1,000 Units by mouth daily.   diclofenac sodium 1 % Gel Commonly known as: VOLTAREN Apply 2 g topically 2 (two) times daily.   doxycycline 100 MG tablet Commonly known as: VIBRA-TABS doxycycline hyclate 100 mg tablet   fluticasone 50 MCG/ACT nasal spray Commonly known as: FLONASE Place 2 sprays into both nostrils daily as needed for allergies or rhinitis.   ibuprofen 800 MG tablet Commonly known as: ADVIL Take 1 tablet (800 mg total) by mouth every 8 (eight) hours as needed. What changed: reasons to take this   lisinopril 20 MG tablet Commonly known as: ZESTRIL Take 20 mg by mouth daily.   loratadine 10 MG tablet Commonly known as: CLARITIN Take 10 mg by mouth daily as needed for allergies.   sildenafil 20 MG tablet Commonly known as: REVATIO Take 20 mg by mouth 3 (three) times daily. Take 3 to 5 tablets  po daily as needed   triamcinolone cream 0.1 % Commonly known as: KENALOG       Allergies:  Allergies  Allergen Reactions  . Penicillins Rash    Has patient had a PCN reaction causing immediate rash, facial/tongue/throat swelling, SOB or lightheadedness with hypotension: Yes Has patient had a PCN reaction causing severe rash involving mucus membranes or skin necrosis: No Has patient had a PCN reaction that required hospitalization: No Has patient had a PCN reaction occurring within the last 10 years: No If all of the above answers are "NO", then may  proceed with Cephalosporin use.    Family History: No family history on file.  Social History:  reports that he has never smoked. He has never used smokeless tobacco. He reports current alcohol use. He reports that he does not use drugs.   Physical Exam: BP (!) 148/79   Pulse 97   Ht 5\' 6"  (1.676 m)   Wt 190 lb (86.2 kg)   BMI 30.67 kg/m   Constitutional:  Alert and oriented, No acute distress. HEENT: [place] AT, moist mucus membranes.  Trachea midline, no masses. Cardiovascular: No clubbing, cyanosis, or edema. Respiratory: Normal respiratory effort, no increased work of breathing.     Assessment & Plan:    -History prostate cancer PSA remains undetectable.  Continue annual follow-up  -History urinary tract calculi KUB ordered and he will be notified with the results.   Abbie Sons, [place] [address] [place],  [address] [phone]

## 2020-03-26 ENCOUNTER — Ambulatory Visit
Admission: RE | Admit: 2020-03-26 | Discharge: 2020-03-26 | Disposition: A | Discharge status: Home / Self Care | Payer: Medicare PPO | Source: Ambulatory Visit | Attending: Urology | Admitting: Urology

## 2020-03-26 ENCOUNTER — Other Ambulatory Visit: Payer: Self-pay

## 2020-03-26 ENCOUNTER — Ambulatory Visit
Admission: RE | Admit: 2020-03-26 | Discharge: 2020-03-26 | Disposition: A | Discharge status: Home / Self Care | Payer: Medicare PPO | Attending: Urology | Admitting: Urology

## 2020-03-26 ENCOUNTER — Other Ambulatory Visit: Payer: Self-pay | Admitting: *Deleted

## 2020-03-26 DIAGNOSIS — N201 Calculus of ureter: Secondary | ICD-10-CM

## 2020-03-26 DIAGNOSIS — Z87442 Personal history of urinary calculi: Secondary | ICD-10-CM | POA: Diagnosis present

## 2020-03-27 ENCOUNTER — Telehealth: Payer: Self-pay | Admitting: *Deleted

## 2020-03-27 NOTE — Telephone Encounter (Signed)
-----   Message from Abbie Sons, MD sent at 03/27/2020  7:48 AM EDT ----- KUB showed no calcifications suspicious for urinary tract stones

## 2020-03-27 NOTE — Telephone Encounter (Signed)
Notified patient as instructed, patient pleased. Discussed follow-up appointments, patient agrees  

## 2021-03-19 ENCOUNTER — Other Ambulatory Visit: Payer: Self-pay

## 2021-03-21 ENCOUNTER — Ambulatory Visit: Payer: Self-pay | Admitting: Urology

## 2021-03-21 ENCOUNTER — Other Ambulatory Visit: Payer: Medicare PPO

## 2021-03-21 ENCOUNTER — Other Ambulatory Visit: Payer: Self-pay

## 2021-03-21 DIAGNOSIS — Z8546 Personal history of malignant neoplasm of prostate: Secondary | ICD-10-CM

## 2021-03-22 LAB — PSA: Prostate Specific Ag, Serum: <0.1 ng/mL (ref 0.0–4.0)

## 2021-03-31 ENCOUNTER — Other Ambulatory Visit: Payer: Self-pay

## 2021-03-31 ENCOUNTER — Ambulatory Visit
Admission: RE | Admit: 2021-03-31 | Discharge: 2021-03-31 | Disposition: A | Discharge status: Home / Self Care | Payer: Medicare PPO | Source: Ambulatory Visit | Attending: Urology | Admitting: Urology

## 2021-03-31 ENCOUNTER — Encounter: Payer: Self-pay | Admitting: Urology

## 2021-03-31 ENCOUNTER — Ambulatory Visit: Payer: Medicare PPO | Admitting: Urology

## 2021-03-31 VITALS — BP 143/87 | HR 92 | Ht 66.0 in | Wt 193.0 lb

## 2021-03-31 DIAGNOSIS — Z8546 Personal history of malignant neoplasm of prostate: Secondary | ICD-10-CM | POA: Diagnosis not present

## 2021-03-31 DIAGNOSIS — N201 Calculus of ureter: Secondary | ICD-10-CM

## 2021-03-31 MED ORDER — SILDENAFIL CITRATE 20 MG PO TABS: 20.0000 mg | ORAL_TABLET | Freq: Three times a day (TID) | ORAL | 0 refills | Status: DC

## 2021-03-31 NOTE — Progress Notes (Signed)
03/31/2021 8:54 AM   [name] 1951/04/02 [id number]  Referring provider: Albina Billet, MD [address]   [place],  [place] [address]  Chief Complaint  Patient presents with  . Prostate Cancer    Urologic history: 1.History T2cintermediate risk prostate cancer -RALP 04/2010 Dr. Alinda Money in [place] -Undetectable PSA postop  2.History urinary calculi -Ureteroscopic removal 3 mmRdistal ureteral calculus 04/2018 -CT showed no renal calculi  HPI: 70 y.o. male presents for annual follow-up.   No problems since last visit  Denies flank, abdominal or pelvic pain  No bothersome LUTS or gross hematuria  PSA 03/21/2021 <0.1   PMH: Past Medical History:  Diagnosis Date  . Arthritis   . Cancer ([place]) [phone]   skin   . History of kidney stones   . Hyperlipidemia   . Hypertension   . MRSA (methicillin resistant staph aureus) culture positive 2003  . Pneumonia 2013  . Prostate cancer [facility])     Surgical History: Past Surgical History:  Procedure Laterality Date  . COLONOSCOPY  2005  . CYSTOSCOPY WITH URETEROSCOPY, STONE BASKETRY AND STENT PLACEMENT Right 05/10/2018   Procedure: CYSTOSCOPY WITH URETEROSCOPY, STONE BASKETRY;  Surgeon: Abbie Sons, MD;  Location: [facility] ORS;  Service: Urology;  Laterality: Right;  . KNEE ARTHROSCOPY Right 1982, 1995  . PROSTATE SURGERY    . SHOULDER ARTHROSCOPY Right 1990  . TONSILLECTOMY      Home Medications:  Allergies as of 03/31/2021      Reactions   Penicillins Rash   Has patient had a PCN reaction causing immediate rash, facial/tongue/throat swelling, SOB or lightheadedness with hypotension: Yes Has patient had a PCN reaction causing severe rash involving mucus membranes or skin necrosis: No Has patient had a PCN reaction that required hospitalization: No Has patient had a PCN reaction occurring within the last 10 years: No If all of the above answers are "NO", then may proceed with Cephalosporin  use.      Medication List       Accurate as of Mar 31, 2021  8:54 AM. If you have any questions, ask your nurse or doctor.        aspirin EC 81 MG tablet Take 81 mg by mouth daily.   atorvastatin 10 MG tablet Commonly known as: LIPITOR Take 10 mg by mouth daily at 6 PM.   betamethasone valerate 0.1 % cream Commonly known as: VALISONE Apply 1 application topically daily as needed (inflammartion).   Centrum Silver Adult 50+ Tabs Take 1 tablet by mouth daily.   cholecalciferol 1000 units tablet Commonly known as: VITAMIN D Take 1,000 Units by mouth daily.   diclofenac sodium 1 % Gel Commonly known as: VOLTAREN Apply 2 g topically 2 (two) times daily.   doxycycline 100 MG tablet Commonly known as: VIBRA-TABS doxycycline hyclate 100 mg tablet   fluticasone 50 MCG/ACT nasal spray Commonly known as: FLONASE Place 2 sprays into both nostrils daily as needed for allergies or rhinitis.   ibuprofen 800 MG tablet Commonly known as: ADVIL Take 1 tablet (800 mg total) by mouth every 8 (eight) hours as needed. What changed: reasons to take this   lisinopril 20 MG tablet Commonly known as: ZESTRIL Take 20 mg by mouth daily.   loratadine 10 MG tablet Commonly known as: CLARITIN Take 10 mg by mouth daily as needed for allergies.   sildenafil 20 MG tablet Commonly known as: REVATIO Take 1 tablet (20 mg total) by mouth 3 (three) times daily.  Take 3 to 5 tablets po daily as needed   triamcinolone cream 0.1 % Commonly known as: KENALOG       Allergies:  Allergies  Allergen Reactions  . Penicillins Rash    Has patient had a PCN reaction causing immediate rash, facial/tongue/throat swelling, SOB or lightheadedness with hypotension: Yes Has patient had a PCN reaction causing severe rash involving mucus membranes or skin necrosis: No Has patient had a PCN reaction that required hospitalization: No Has patient had a PCN reaction occurring within the last 10 years: No If  all of the above answers are "NO", then may proceed with Cephalosporin use.    Family History: History reviewed. No pertinent family history.  Social History:  reports that he has never smoked. He has never used smokeless tobacco. He reports current alcohol use. He reports that he does not use drugs.   Physical Exam: BP (!) 143/87   Pulse 92   Ht 5\' 6"  (1.676 m)   Wt 193 lb (87.5 kg)   BMI 31.15 kg/m   Constitutional:  Alert and oriented, No acute distress. HEENT: [place] AT, moist mucus membranes.  Trachea midline, no masses. Cardiovascular: No clubbing, cyanosis, or edema. Respiratory: Normal respiratory effort, no increased work of breathing. Neurologic: Grossly intact, no focal deficits, moving all 4 extremities. Psychiatric: Normal mood and affect.   Assessment & Plan:    1.  History of prostate cancer  Stable  PSA remains undetectable  Continue annual follow-up with PSA  2.  Personal history urinary calculi  Stable, asymptomatic  KUB today and if no evidence recurrent stone will go to every other year [place], [place]  [place] [address] [place], [place] [address] [phone]

## 2021-04-04 ENCOUNTER — Telehealth: Payer: Self-pay | Admitting: *Deleted

## 2021-04-04 NOTE — Telephone Encounter (Signed)
-----   Message from Abbie Sons, MD sent at 04/04/2021  2:59 PM EDT ----- KUB showed no calcifications suspicious for urinary calculi

## 2021-04-04 NOTE — Telephone Encounter (Signed)
Notified patient as instructed, patient pleased. Discussed follow-up appointments, patient agrees  

## 2021-04-16 ENCOUNTER — Other Ambulatory Visit: Payer: Self-pay | Admitting: Urology

## 2021-10-03 ENCOUNTER — Telehealth: Payer: Self-pay

## 2021-10-03 NOTE — Telephone Encounter (Signed)
Incoming call from pt on triage line who states that he had blood in the urine yesterday and questions if he needs to be seen. Pt states that he has one episode of blood in the urine yesterday, he denies pain, fever, n/v, or chills. He does note that he was very active yesterday doing 6-4-7 hours of yard work. He does have a hx of kidney stones. Pt added to schedule in first available. Pt given ER precautions for stone episode over the weekend. PT voiced understanding.

## 2021-10-06 NOTE — Progress Notes (Signed)
10/07/2021 7:42 AM   Dalton Dunn 1951-11-08 [id number]  Referring provider: Albina Billet, MD [address]   [place],  [place] [address]  Chief Complaint  Patient presents with   Hematuria   Urological history  1. 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 Mendel Ryder in June 2011  2. Nephrolithiasis -stone composition of 60% CaOx monohydrate, 35% CaOx dihydrate and 5% Ca phosphate -right urs 2019 -KUB 03/2021 - negative for stones  HPI: Dalton Dunn is a 70 y.o. male who presents today for gross heme.  UA negative for gross heme  He states that last week he suffered a incident of painless gross hematuria with the passage of one clot after the day before working 7 hours in the yard and being dehydrated.  He has not had any episodes of gross hematuria since.  He has no other urinary symptoms at this time.  Patient denies any modifying or aggravating factors.  Patient denies any gross hematuria, dysuria or suprapubic/flank pain.  Patient denies any fevers, chills, nausea or vomiting.       PMH: Past Medical History:  Diagnosis Date   Arthritis    Cancer ([place]) [phone]   skin    History of kidney stones    Hyperlipidemia    Hypertension    MRSA (methicillin resistant staph aureus) culture positive 2003   Pneumonia 2013   Prostate cancer [facility])     Surgical History: Past Surgical History:  Procedure Laterality Date   COLONOSCOPY  2005   CYSTOSCOPY WITH URETEROSCOPY, STONE BASKETRY AND STENT PLACEMENT Right 05/10/2018   Procedure: CYSTOSCOPY WITH URETEROSCOPY, STONE BASKETRY;  Surgeon: Abbie Sons, MD;  Location: [facility] ORS;  Service: Urology;  Laterality: Right;   KNEE ARTHROSCOPY Right 1982, 1995   PROSTATE SURGERY     SHOULDER ARTHROSCOPY Right 1990   TONSILLECTOMY      Home Medications:  Allergies as of 10/07/2021       Reactions   Penicillins Rash   Has  patient had a PCN reaction causing immediate rash, facial/tongue/throat swelling, SOB or lightheadedness with hypotension: Yes Has patient had a PCN reaction causing severe rash involving mucus membranes or skin necrosis: No Has patient had a PCN reaction that required hospitalization: No Has patient had a PCN reaction occurring within the last 10 years: No If all of the above answers are "NO", then may proceed with Cephalosporin use.        Medication List        Accurate as of October 07, 2021 11:59 PM. If you have any questions, ask your nurse or doctor.          aspirin EC 81 MG tablet Take 81 mg by mouth daily.   atorvastatin 20 MG tablet Commonly known as: LIPITOR SMARTSIG:1 Tablet(s) By Mouth Every Evening What changed: Another medication with the same name was removed. Continue taking this medication, and follow the directions you see here. Changed by: Zara Council, PA-C   betamethasone valerate 0.1 % cream Commonly known as: VALISONE Apply 1 application topically daily as needed (inflammartion).   Centrum Silver Adult 50+ Tabs Take 1 tablet by mouth daily.   cholecalciferol 1000 units tablet Commonly known as: VITAMIN D Take 1,000 Units by mouth daily.   diclofenac sodium 1 % Gel Commonly known as: VOLTAREN Apply 2 g topically 2 (two) times daily.   doxycycline 100 MG tablet Commonly known  as: VIBRA-TABS doxycycline hyclate 100 mg tablet   fluticasone 50 MCG/ACT nasal spray Commonly known as: FLONASE Place 2 sprays into both nostrils daily as needed for allergies or rhinitis.   ibuprofen 800 MG tablet Commonly known as: ADVIL Take 1 tablet (800 mg total) by mouth every 8 (eight) hours as needed. What changed: reasons to take this   lisinopril 20 MG tablet Commonly known as: ZESTRIL Take 20 mg by mouth daily.   loratadine 10 MG tablet Commonly known as: CLARITIN Take 10 mg by mouth daily as needed for allergies.   sildenafil 20 MG  tablet Commonly known as: REVATIO TAKE THREE (3) TO FIVE (5) TABLETS BY MOUTH DAILY AS NEEDED   triamcinolone cream 0.1 % Commonly known as: KENALOG        Allergies:  Allergies  Allergen Reactions   Penicillins Rash    Has patient had a PCN reaction causing immediate rash, facial/tongue/throat swelling, SOB or lightheadedness with hypotension: Yes Has patient had a PCN reaction causing severe rash involving mucus membranes or skin necrosis: No Has patient had a PCN reaction that required hospitalization: No Has patient had a PCN reaction occurring within the last 10 years: No If all of the above answers are "NO", then may proceed with Cephalosporin use.    Family History: History reviewed. No pertinent family history.  Social History:  reports that he has never smoked. He has never used smokeless tobacco. He reports current alcohol use. He reports that he does not use drugs.  ROS: Pertinent ROS in HPI  Physical Exam: BP (!) 156/92   Pulse 88   Ht 5\' 6"  (1.676 m)   Wt 190 lb (86.2 kg)   BMI 30.67 kg/m   Constitutional:  Well nourished. Alert and oriented, No acute distress. HEENT: [place] AT, mask in place.  Trachea midline Cardiovascular: No clubbing, cyanosis, or edema. Respiratory: Normal respiratory effort, no increased work of breathing. Neurologic: Grossly intact, no focal deficits, moving all 4 extremities. Psychiatric: Normal mood and affect.  Laboratory Data: Urinalysis Component     Latest Ref Rng & Units 10/07/2021  Specific Gravity, UA     1.005 - 1.030 1.020  pH, UA     5.0 - 7.5 7.0  Color, UA     Yellow Yellow  Appearance Ur     Clear Clear  Leukocytes,UA     Negative Negative  Protein,UA     Negative/Trace Negative  Glucose, UA     Negative Negative  Ketones, UA     Negative Negative  RBC, UA     Negative Negative  Bilirubin, UA     Negative Negative  Urobilinogen, Ur     0.2 - 1.0 mg/dL 1.0  Nitrite, UA     Negative Negative   Microscopic Examination      See below:   Component     Latest Ref Rng & Units 10/07/2021  WBC, UA     0 - 5 /hpf 0-5  RBC     0 - 2 /hpf None seen  Epithelial Cells (non renal)     0 - 10 /hpf None seen  Bacteria, UA     None seen/Few None seen  I have reviewed the labs.   Pertinent Imaging: N/A   Assessment & Plan:    1. Gross hematuria - I explained to the patient that there are a number of causes that can be associated with blood in the urine, such as stones, BPH, UTI's, damage to  the urinary tract and/or cancer. -at this time, they are in a high risk stratification for hematuria per AUA guidelines due to their age (>60 years) and gross heme  -recommended studies for high risk are CT urogram and cysto - I explained to the patient that a contrast material will be injected into a vein and that in rare instances, an allergic reaction can result and may even life threatening (1:100,000)  The patient denies any allergies to contrast, iodine and/or seafood and is not taking metformin - Following the imaging study,  I've recommended a cystoscopy. I described how this is performed, typically in an office setting with a flexible cystoscope. We described the risks, benefits, and possible side effects, the most common of which is a minor amount of blood in the urine and/or burning which usually resolves in 24 to 48 hours.    -The patient had the opportunity to ask questions which were answered. Based upon this discussion, the patient is willing to proceed. Therefore, I've ordered: a CT Urogram and cystoscopy. - The patient will return following all of the above for discussion of the results.  - UA - Urine culture     Return for CT Urogram report and cystoscopy.  These notes generated with voice recognition software. I apologize for typographical errors.  Zara Council, PA-C  [facility] [address]  [place], [place] [address] [phone]

## 2021-10-07 ENCOUNTER — Encounter: Payer: Self-pay | Admitting: Urology

## 2021-10-07 ENCOUNTER — Other Ambulatory Visit: Payer: Self-pay

## 2021-10-07 ENCOUNTER — Ambulatory Visit: Payer: Medicare PPO | Admitting: Urology

## 2021-10-07 VITALS — BP 156/92 | HR 88 | Ht 66.0 in | Wt 190.0 lb

## 2021-10-07 DIAGNOSIS — R310 Gross hematuria: Secondary | ICD-10-CM

## 2021-10-08 LAB — URINALYSIS, COMPLETE
Bilirubin, UA: NEGATIVE
Glucose, UA: NEGATIVE
Ketones, UA: NEGATIVE
Leukocytes,UA: NEGATIVE
Nitrite, UA: NEGATIVE
Protein,UA: NEGATIVE
RBC, UA: NEGATIVE
Specific Gravity, UA: 1.02 (ref 1.005–1.030)
Urobilinogen, Ur: 1 mg/dL (ref 0.2–1.0)
pH, UA: 7 (ref 5.0–7.5)

## 2021-10-08 LAB — MICROSCOPIC EXAMINATION
Bacteria, UA: NONE SEEN
Epithelial Cells (non renal): NONE SEEN /hpf (ref 0–10)
RBC: NONE SEEN /hpf (ref 0–2)

## 2021-10-15 LAB — CULTURE, URINE COMPREHENSIVE

## 2021-10-16 ENCOUNTER — Other Ambulatory Visit: Payer: Self-pay

## 2021-10-16 ENCOUNTER — Ambulatory Visit
Admission: RE | Admit: 2021-10-16 | Discharge: 2021-10-16 | Disposition: A | Discharge status: Home / Self Care | Payer: Medicare PPO | Source: Ambulatory Visit | Attending: Urology | Admitting: Urology

## 2021-10-16 DIAGNOSIS — R310 Gross hematuria: Secondary | ICD-10-CM | POA: Diagnosis present

## 2021-10-16 LAB — POCT I-STAT CREATININE: Creatinine, Ser: 1.1 mg/dL (ref 0.61–1.24)

## 2021-10-16 MED ORDER — IOHEXOL 300 MG/ML  SOLN
100.0000 mL | Freq: Once | INTRAMUSCULAR | Status: AC | PRN
  Administered 2021-10-16: 100 mL via INTRAVENOUS

## 2021-11-13 ENCOUNTER — Other Ambulatory Visit: Payer: Medicare PPO | Admitting: Urology

## 2021-11-14 ENCOUNTER — Encounter: Payer: Self-pay | Admitting: Urology

## 2021-11-14 ENCOUNTER — Ambulatory Visit: Payer: Medicare PPO | Admitting: Urology

## 2021-11-14 ENCOUNTER — Other Ambulatory Visit: Payer: Self-pay

## 2021-11-14 VITALS — BP 150/92 | HR 50 | Ht 66.0 in | Wt 190.0 lb

## 2021-11-14 DIAGNOSIS — R310 Gross hematuria: Secondary | ICD-10-CM | POA: Diagnosis not present

## 2021-11-14 LAB — MICROSCOPIC EXAMINATION
Bacteria, UA: NONE SEEN
Epithelial Cells (non renal): NONE SEEN /hpf (ref 0–10)
RBC: NONE SEEN /hpf (ref 0–2)
WBC, UA: NONE SEEN /hpf (ref 0–5)

## 2021-11-14 LAB — URINALYSIS, COMPLETE
Bilirubin, UA: NEGATIVE
Glucose, UA: NEGATIVE
Ketones, UA: NEGATIVE
Leukocytes,UA: NEGATIVE
Nitrite, UA: NEGATIVE
Protein,UA: NEGATIVE
RBC, UA: NEGATIVE
Specific Gravity, UA: 1.025 (ref 1.005–1.030)
Urobilinogen, Ur: 0.2 mg/dL (ref 0.2–1.0)
pH, UA: 5 (ref 5.0–7.5)

## 2021-11-14 NOTE — Progress Notes (Signed)
° °  11/14/21  CC:  Chief Complaint  Patient presents with   Cysto    HPI: Isolated episode total gross painless hematuria.  See Shannon's note 10/07/2021.  Denies recurrent hematuria.  CT urogram 10/16/2021 without upper tract abnormalities.  UA today negative RBC  Blood pressure (!) 150/92, pulse (!) 50, height 5\' 6"  (1.676 m), weight 190 lb (86.2 kg).   Cystoscopy Procedure Note  Patient identification was confirmed, informed consent was obtained, and patient was prepped using Betadine solution.  Lidocaine jelly was administered per urethral meatus.     Pre-Procedure: - Inspection reveals a normal caliber urethral meatus.  Procedure: The flexible cystoscope was introduced without difficulty - No urethral strictures/lesions are present. - Prostate surgically absent - Normal bladder neck - Bilateral ureteral orifices identified - Bladder mucosa  reveals no ulcers, tumors, or lesions - No bladder stones - No trabeculation  Retroflexion shows no abnormalities   Post-Procedure: - Patient tolerated the procedure well  Assessment/ Plan: No upper tract abnormalities CTU No urethral/bladder mucosal abnormalities on cystoscopy Urine cytology sent Keep regularly scheduled follow-up Call earlier for recurrent gross hematuria    Abbie Sons, MD

## 2021-11-17 ENCOUNTER — Encounter: Payer: Self-pay | Admitting: Urology

## 2021-11-19 LAB — CYTOLOGY - NON PAP

## 2021-11-20 ENCOUNTER — Encounter: Payer: Self-pay | Admitting: *Deleted

## 2022-01-22 ENCOUNTER — Other Ambulatory Visit: Payer: Self-pay | Admitting: Orthopedic Surgery

## 2022-01-22 DIAGNOSIS — M25561 Pain in right knee: Secondary | ICD-10-CM

## 2022-02-01 ENCOUNTER — Ambulatory Visit
Admission: RE | Admit: 2022-02-01 | Discharge: 2022-02-01 | Disposition: A | Discharge status: Home / Self Care | Payer: Medicare PPO | Source: Ambulatory Visit | Attending: Orthopedic Surgery | Admitting: Orthopedic Surgery

## 2022-02-01 ENCOUNTER — Other Ambulatory Visit: Payer: Self-pay

## 2022-02-01 DIAGNOSIS — M25561 Pain in right knee: Secondary | ICD-10-CM | POA: Insufficient documentation

## 2022-03-27 ENCOUNTER — Other Ambulatory Visit: Payer: Medicare PPO

## 2022-03-27 DIAGNOSIS — Z8546 Personal history of malignant neoplasm of prostate: Secondary | ICD-10-CM

## 2022-03-28 LAB — PSA: Prostate Specific Ag, Serum: <0.1 ng/mL (ref 0.0–4.0)

## 2022-04-01 ENCOUNTER — Ambulatory Visit: Payer: Self-pay | Admitting: Urology

## 2022-04-08 ENCOUNTER — Ambulatory Visit: Payer: Medicare PPO | Admitting: Urology

## 2022-04-08 ENCOUNTER — Encounter: Payer: Self-pay | Admitting: Urology

## 2022-04-08 VITALS — BP 166/71 | HR 83 | Ht 66.0 in | Wt 189.0 lb

## 2022-04-08 DIAGNOSIS — Z87442 Personal history of urinary calculi: Secondary | ICD-10-CM

## 2022-04-08 DIAGNOSIS — Z87898 Personal history of other specified conditions: Secondary | ICD-10-CM

## 2022-04-08 DIAGNOSIS — R310 Gross hematuria: Secondary | ICD-10-CM | POA: Diagnosis not present

## 2022-04-08 DIAGNOSIS — Z8546 Personal history of malignant neoplasm of prostate: Secondary | ICD-10-CM

## 2022-04-08 NOTE — Progress Notes (Signed)
04/08/2022 2:50 PM   Dalton Dunn 10-14-1951 [id number]  Referring provider: Albina Billet, MD [address]   [place],   [address]  Chief Complaint  Patient presents with   Follow-up    Urologic history:  1. 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 Alinda Money in [place] -Undetectable PSA postop   2.  History urinary calculi -Ureteroscopic removal 3 mm R distal ureteral calculus 04/2018 -CT showed no renal calculi  3.  Gross hematuria -November 2022 -CTU without upper tract abnormalities or calculi -Cystoscopy unremarkable -Negative cytology  HPI: 71 y.o. male presents for annual follow-up.  Since last years visit he was seen December 2022 after an episode of gross hematuria.  CT urogram showed no upper tract abnormality or urinary tract calculi.  Cystoscopy was unremarkable; urine cytology negative Denies flank, abdominal or pelvic pain No bothersome LUTS or recurrent gross hematuria PSA 03/27/2022 < 0.1   PMH: Past Medical History:  Diagnosis Date   Arthritis    Cancer ([place]) [phone]   skin    History of kidney stones    Hyperlipidemia    Hypertension    MRSA (methicillin resistant staph aureus) culture positive 2003   Pneumonia 2013   Prostate cancer [facility])     Surgical History: Past Surgical History:  Procedure Laterality Date   COLONOSCOPY  2005   CYSTOSCOPY WITH URETEROSCOPY, STONE BASKETRY AND STENT PLACEMENT Right 05/10/2018   Procedure: CYSTOSCOPY WITH URETEROSCOPY, STONE BASKETRY;  Surgeon: Abbie Sons, MD;  Location: [facility] ORS;  Service: Urology;  Laterality: Right;   KNEE ARTHROSCOPY Right 1982, 1995   PROSTATE SURGERY     SHOULDER ARTHROSCOPY Right 1990   TONSILLECTOMY      Home Medications:  Allergies as of 04/08/2022       Reactions   Penicillins Rash   Has patient had a PCN reaction causing immediate rash, facial/tongue/throat swelling, SOB or lightheadedness with hypotension: Yes Has  patient had a PCN reaction causing severe rash involving mucus membranes or skin necrosis: No Has patient had a PCN reaction that required hospitalization: No Has patient had a PCN reaction occurring within the last 10 years: No If all of the above answers are "NO", then may proceed with Cephalosporin use.        Medication List        Accurate as of Apr 08, 2022  2:50 PM. If you have any questions, ask your nurse or doctor.          STOP taking these medications    triamcinolone cream 0.1 % Commonly known as: KENALOG Stopped by: Abbie Sons, MD       TAKE these medications    aspirin EC 81 MG tablet Take 81 mg by mouth daily.   atorvastatin 20 MG tablet Commonly known as: LIPITOR SMARTSIG:1 Tablet(s) By Mouth Every Evening   Centrum Silver Adult 50+ Tabs Take 1 tablet by mouth daily.   cholecalciferol 1000 units tablet Commonly known as: VITAMIN D Take 1,000 Units by mouth daily.   diclofenac sodium 1 % Gel Commonly known as: VOLTAREN Apply 2 g topically 2 (two) times daily.   fluticasone 50 MCG/ACT nasal spray Commonly known as: FLONASE Place 2 sprays into both nostrils daily as needed for allergies or rhinitis.   lisinopril 20 MG tablet Commonly known as: ZESTRIL Take 20 mg by mouth daily.   loratadine 10 MG tablet Commonly known as: CLARITIN Take 10 mg by mouth daily as needed  for allergies.   sildenafil 20 MG tablet Commonly known as: REVATIO TAKE THREE (3) TO FIVE (5) TABLETS BY MOUTH DAILY AS NEEDED        Allergies:  Allergies  Allergen Reactions   Penicillins Rash    Has patient had a PCN reaction causing immediate rash, facial/tongue/throat swelling, SOB or lightheadedness with hypotension: Yes Has patient had a PCN reaction causing severe rash involving mucus membranes or skin necrosis: No Has patient had a PCN reaction that required hospitalization: No Has patient had a PCN reaction occurring within the last 10 years: No If  all of the above answers are "NO", then may proceed with Cephalosporin use.    Family History: History reviewed. No pertinent family history.  Social History:  reports that he has never smoked. He has never used smokeless tobacco. He reports current alcohol use. He reports that he does not use drugs.   Physical Exam: BP (!) 166/71   Pulse 83   Ht '5\' 6"'$  (1.676 m)   Wt 189 lb (85.7 kg)   BMI 30.51 kg/m   Constitutional:  Alert and oriented, No acute distress. HEENT: [place] AT, moist mucus membranes.  Trachea midline, no masses. Cardiovascular: No clubbing, cyanosis, or edema. Respiratory: Normal respiratory effort, no increased work of breathing. Neurologic: Grossly intact, no focal deficits, moving all 4 extremities. Psychiatric: Normal mood and affect.   Assessment & Plan:    1.  History of prostate cancer Stable PSA remains undetectable Continue annual follow-up with PSA  2.  Personal history urinary calculi CTU 2022 showed no urinary tract calculi   Abbie Sons, MD  [facility] [address], [place], [place] [address] [phone]

## 2022-04-09 LAB — URINALYSIS, COMPLETE
Bilirubin, UA: NEGATIVE
Glucose, UA: NEGATIVE
Ketones, UA: NEGATIVE
Leukocytes,UA: NEGATIVE
Nitrite, UA: NEGATIVE
Protein,UA: NEGATIVE
RBC, UA: NEGATIVE
Specific Gravity, UA: 1.02 (ref 1.005–1.030)
Urobilinogen, Ur: 0.2 mg/dL (ref 0.2–1.0)
pH, UA: 6.5 (ref 5.0–7.5)

## 2022-04-09 LAB — MICROSCOPIC EXAMINATION: Bacteria, UA: NONE SEEN

## 2023-04-08 ENCOUNTER — Other Ambulatory Visit: Payer: Self-pay | Admitting: *Deleted

## 2023-04-08 DIAGNOSIS — Z8546 Personal history of malignant neoplasm of prostate: Secondary | ICD-10-CM

## 2023-04-09 ENCOUNTER — Other Ambulatory Visit: Payer: Medicare PPO

## 2023-04-09 DIAGNOSIS — Z8546 Personal history of malignant neoplasm of prostate: Secondary | ICD-10-CM

## 2023-04-10 LAB — PSA: Prostate Specific Ag, Serum: <0.1 ng/mL (ref 0.0–4.0)

## 2023-04-14 ENCOUNTER — Ambulatory Visit: Payer: Medicare PPO | Admitting: Urology

## 2023-04-15 ENCOUNTER — Encounter: Payer: Self-pay | Admitting: Urology

## 2023-04-15 ENCOUNTER — Ambulatory Visit: Payer: Medicare PPO | Admitting: Urology

## 2023-04-15 VITALS — BP 175/82 | HR 0 | Ht 66.0 in | Wt 182.0 lb

## 2023-04-15 DIAGNOSIS — Z08 Encounter for follow-up examination after completed treatment for malignant neoplasm: Secondary | ICD-10-CM | POA: Diagnosis not present

## 2023-04-15 DIAGNOSIS — Z8546 Personal history of malignant neoplasm of prostate: Secondary | ICD-10-CM

## 2023-04-15 DIAGNOSIS — C61 Malignant neoplasm of prostate: Secondary | ICD-10-CM

## 2023-04-15 NOTE — Progress Notes (Signed)
Marcelle Overlie Plume,acting as a scribe for Riki Altes, MD.,have documented all relevant documentation on the behalf of Riki Altes, MD,as directed by  Riki Altes, MD while in the presence of Riki Altes, MD.  04/15/2023 11:49 AM   Dalton Dunn 1951-04-23 [id number]  Referring provider: Jaclyn Shaggy, MD [address]   [place],  [place] [address]  Chief Complaint  Patient presents with   Elevated PSA    Urologic history:  1. 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 Laverle Patter in [place] -Undetectable PSA postop   2.  History urinary calculi -Ureteroscopic removal 3 mm R distal ureteral calculus 04/2018 -CT showed no renal calculi  3.  Gross hematuria -November 2022 -CTU without upper tract abnormalities or calculi -Cystoscopy unremarkable -Negative cytology  HPI: 72 y.o. male presents for annual follow-up.  Doing well since last visit No bothersome LUTS Denies dysuria, gross hematuria Denies flank, abdominal or pelvic pain PSA 04/09/2023 undetectable at <0.1   PMH: Past Medical History:  Diagnosis Date   Arthritis    Cancer ([facility]) [phone]   skin    History of kidney stones    Hyperlipidemia    Hypertension    MRSA (methicillin resistant staph aureus) culture positive 2003   Pneumonia 2013   Prostate cancer [facility])     Surgical History: Past Surgical History:  Procedure Laterality Date   COLONOSCOPY  2005   CYSTOSCOPY WITH URETEROSCOPY, STONE BASKETRY AND STENT PLACEMENT Right 05/10/2018   Procedure: CYSTOSCOPY WITH URETEROSCOPY, STONE BASKETRY;  Surgeon: Riki Altes, MD;  Location: [facility] ORS;  Service: Urology;  Laterality: Right;   KNEE ARTHROSCOPY Right 1982, 1995   PROSTATE SURGERY     SHOULDER ARTHROSCOPY Right 1990   TONSILLECTOMY      Home Medications:  Allergies as of 04/15/2023       Reactions   Penicillins Rash   Has patient had a PCN reaction causing immediate rash, facial/tongue/throat  swelling, SOB or lightheadedness with hypotension: Yes Has patient had a PCN reaction causing severe rash involving mucus membranes or skin necrosis: No Has patient had a PCN reaction that required hospitalization: No Has patient had a PCN reaction occurring within the last 10 years: No If all of the above answers are "NO", then may proceed with Cephalosporin use.        Medication List        Accurate as of Apr 15, 2023 11:49 AM. If you have any questions, ask your nurse or doctor.          aspirin EC 81 MG tablet Take 81 mg by mouth daily.   atorvastatin 20 MG tablet Commonly known as: LIPITOR SMARTSIG:1 Tablet(s) By Mouth Every Evening   Centrum Silver Adult 50+ Tabs Take 1 tablet by mouth daily.   cholecalciferol 1000 units tablet Commonly known as: VITAMIN D Take 1,000 Units by mouth daily.   diclofenac sodium 1 % Gel Commonly known as: VOLTAREN Apply 2 g topically 2 (two) times daily.   fluticasone 50 MCG/ACT nasal spray Commonly known as: FLONASE Place 2 sprays into both nostrils daily as needed for allergies or rhinitis.   lisinopril 20 MG tablet Commonly known as: ZESTRIL Take 20 mg by mouth daily.   loratadine 10 MG tablet Commonly known as: CLARITIN Take 10 mg by mouth daily as needed for allergies.   sildenafil 20 MG tablet Commonly known as: REVATIO TAKE THREE (3) TO FIVE (5) TABLETS BY  MOUTH DAILY AS NEEDED        Allergies:  Allergies  Allergen Reactions   Penicillins Rash    Has patient had a PCN reaction causing immediate rash, facial/tongue/throat swelling, SOB or lightheadedness with hypotension: Yes Has patient had a PCN reaction causing severe rash involving mucus membranes or skin necrosis: No Has patient had a PCN reaction that required hospitalization: No Has patient had a PCN reaction occurring within the last 10 years: No If all of the above answers are "NO", then may proceed with Cephalosporin use.    Social History:   reports that he has never smoked. He has never used smokeless tobacco. He reports current alcohol use. He reports that he does not use drugs.   Physical Exam: BP (!) 175/82   Pulse (!) 0   Ht 5\' 6"  (1.676 m)   Wt 182 lb (82.6 kg)   BMI 29.38 kg/m   Constitutional:  Alert and oriented, No acute distress. HEENT: [place] AT, moist mucus membranes.  Trachea midline, no masses. Cardiovascular: No clubbing, cyanosis, or edema. Respiratory: Normal respiratory effort, no increased work of breathing. Neurologic: Grossly intact, no focal deficits, moving all 4 extremities. Psychiatric: Normal mood and affect.   Assessment & Plan:    1.  History of prostate cancer PSA remains undetectable Annual follow up with PSA  I have reviewed the above documentation for accuracy and completeness, and I agree with the above.   Riki Altes, MD  [facility] [address] [place], [place] [address] [phone]

## 2023-04-19 ENCOUNTER — Other Ambulatory Visit: Payer: Self-pay | Admitting: Internal Medicine

## 2023-04-19 ENCOUNTER — Ambulatory Visit
Admission: RE | Admit: 2023-04-19 | Discharge: 2023-04-19 | Disposition: A | Discharge status: Home / Self Care | Payer: Medicare PPO | Source: Ambulatory Visit | Attending: Internal Medicine | Admitting: Internal Medicine

## 2023-04-19 ENCOUNTER — Ambulatory Visit
Admission: RE | Admit: 2023-04-19 | Discharge: 2023-04-19 | Disposition: A | Discharge status: Home / Self Care | Payer: Medicare PPO | Attending: Internal Medicine | Admitting: Internal Medicine

## 2023-04-19 DIAGNOSIS — M542 Cervicalgia: Secondary | ICD-10-CM | POA: Diagnosis present

## 2024-04-11 ENCOUNTER — Other Ambulatory Visit: Payer: Self-pay

## 2024-04-14 ENCOUNTER — Ambulatory Visit: Payer: Self-pay | Admitting: Urology

## 2024-04-14 ENCOUNTER — Other Ambulatory Visit: Payer: Self-pay

## 2024-04-14 DIAGNOSIS — C61 Malignant neoplasm of prostate: Secondary | ICD-10-CM

## 2024-04-17 ENCOUNTER — Other Ambulatory Visit: Payer: Medicare PPO

## 2024-04-17 DIAGNOSIS — C61 Malignant neoplasm of prostate: Secondary | ICD-10-CM

## 2024-04-18 LAB — PSA: Prostate Specific Ag, Serum: <0.1 ng/mL (ref 0.0–4.0)

## 2024-04-19 ENCOUNTER — Encounter: Payer: Self-pay | Admitting: Urology

## 2024-04-19 ENCOUNTER — Ambulatory Visit: Payer: Medicare PPO | Admitting: Urology

## 2024-04-19 VITALS — BP 159/94 | HR 87 | Ht 66.0 in | Wt 188.0 lb

## 2024-04-19 DIAGNOSIS — Z8546 Personal history of malignant neoplasm of prostate: Secondary | ICD-10-CM

## 2024-04-19 DIAGNOSIS — Z08 Encounter for follow-up examination after completed treatment for malignant neoplasm: Secondary | ICD-10-CM | POA: Diagnosis not present

## 2024-04-19 NOTE — Progress Notes (Signed)
 04/19/2024 8:55 AM   Dalton Dunn [birth year] [id number]  Referring provider: Westley Hammers, MD [address]   [place],  [place] [address]  Chief Complaint  Patient presents with   Prostate Cancer    Urologic history:  1. 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 Rozanne Corners in [place] -Undetectable PSA postop   2.  History urinary calculi -Ureteroscopic removal 3 mm R distal ureteral calculus 04/2018 -CT showed no renal calculi  3.  Gross hematuria -November 2022 -CTU without upper tract abnormalities or calculi -Cystoscopy unremarkable -Negative cytology  HPI: 74 y.o. male presents for annual follow-up.  Doing well since last visit No bothersome LUTS Denies dysuria, gross hematuria Denies flank, abdominal or pelvic pain PSA 04/17/2024 undetectable at <0.1   PMH: Past Medical History:  Diagnosis Date   Arthritis    Cancer ([facility]) [phone]   skin    History of kidney stones    Hyperlipidemia    Hypertension    MRSA (methicillin resistant staph aureus) culture positive 2003   Pneumonia 2013   Prostate cancer [facility])     Surgical History: Past Surgical History:  Procedure Laterality Date   COLONOSCOPY  2005   CYSTOSCOPY WITH URETEROSCOPY, STONE BASKETRY AND STENT PLACEMENT Right 05/10/2018   Procedure: CYSTOSCOPY WITH URETEROSCOPY, STONE BASKETRY;  Surgeon: Geraline Knapp, MD;  Location: [facility] ORS;  Service: Urology;  Laterality: Right;   KNEE ARTHROSCOPY Right 1982, 1995   PROSTATE SURGERY     SHOULDER ARTHROSCOPY Right 1990   TONSILLECTOMY      Home Medications:  Allergies as of 04/19/2024       Reactions   Penicillins Rash   Has patient had a PCN reaction causing immediate rash, facial/tongue/throat swelling, SOB or lightheadedness with hypotension: Yes Has patient had a PCN reaction causing severe rash involving mucus membranes or skin necrosis: No Has patient had a PCN reaction that required hospitalization: No Has  patient had a PCN reaction occurring within the last 10 years: No If all of the above answers are "NO", then may proceed with Cephalosporin use.        Medication List        Accurate as of April 19, 2024  8:55 AM. If you have any questions, ask your nurse or doctor.          STOP taking these medications    diclofenac sodium 1 % Gel Commonly known as: VOLTAREN       TAKE these medications    aspirin EC 81 MG tablet Take 81 mg by mouth daily.   atorvastatin 20 MG tablet Commonly known as: LIPITOR SMARTSIG:1 Tablet(s) By Mouth Every Evening   Centrum Silver Adult 50+ Tabs Take 1 tablet by mouth daily.   cholecalciferol 1000 units tablet Commonly known as: VITAMIN D Take 1,000 Units by mouth daily.   fluticasone 50 MCG/ACT nasal spray Commonly known as: FLONASE Place 2 sprays into both nostrils as needed for allergies or rhinitis.   lisinopril 20 MG tablet Commonly known as: ZESTRIL Take 20 mg by mouth daily.   loratadine 10 MG tablet Commonly known as: CLARITIN Take 10 mg by mouth as needed for allergies.   traMADol 50 MG tablet Commonly known as: ULTRAM Take 1- 2 tablet(s) EVERY 12 HOURS by oral route. as needed for moderate pain        Allergies:  Allergies  Allergen Reactions   Penicillins Rash    Has patient had a PCN  reaction causing immediate rash, facial/tongue/throat swelling, SOB or lightheadedness with hypotension: Yes Has patient had a PCN reaction causing severe rash involving mucus membranes or skin necrosis: No Has patient had a PCN reaction that required hospitalization: No Has patient had a PCN reaction occurring within the last 10 years: No If all of the above answers are "NO", then may proceed with Cephalosporin use.    Social History:  reports that he has never smoked. He has never used smokeless tobacco. He reports current alcohol use. He reports that he does not use drugs.   Physical Exam: BP (!) 159/94   Pulse 87   Ht 5'  6" (1.676 m)   Wt 188 lb (85.3 kg)   BMI 30.34 kg/m   Constitutional:  Alert and oriented, No acute distress. HEENT: [place] AT Respiratory: Normal respiratory effort, no increased work of breathing. Psychiatric: Normal mood and affect.   Assessment & Plan:    1.  History of prostate cancer PSA remains undetectable Discussed continuing annual follow-up here or if his PCP will check annually he can return as needed.  He states his PCP is retiring and will continue annual follow-ups here   Geraline Knapp, MD  [facility] [address], [address] [place], [place] [address] [phone]

## 2025-04-17 ENCOUNTER — Other Ambulatory Visit

## 2025-04-19 ENCOUNTER — Ambulatory Visit: Admitting: Urology
